# Patient Record
Sex: Male | Born: 1965 | Race: White | Hispanic: No | Marital: Single | Smoking: Never smoker
Health system: Southern US, Community
[De-identification: ages and names within clinical notes are randomized; demographics above are authoritative.]

## PROBLEM LIST (undated history)

## (undated) DIAGNOSIS — E785 Hyperlipidemia, unspecified: Secondary | ICD-10-CM

## (undated) DIAGNOSIS — N400 Enlarged prostate without lower urinary tract symptoms: Secondary | ICD-10-CM

## (undated) DIAGNOSIS — G4730 Sleep apnea, unspecified: Secondary | ICD-10-CM

## (undated) DIAGNOSIS — M5030 Other cervical disc degeneration, unspecified cervical region: Secondary | ICD-10-CM

## (undated) HISTORY — DX: Benign prostatic hyperplasia without lower urinary tract symptoms: N40.0

## (undated) HISTORY — DX: Other cervical disc degeneration, unspecified cervical region: M50.30

## (undated) HISTORY — DX: Sleep apnea, unspecified: G47.30

## (undated) HISTORY — PX: LASIK: SHX215

## (undated) HISTORY — DX: Hyperlipidemia, unspecified: E78.5

---
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
## 1993-01-19 HISTORY — PX: HERNIA REPAIR: SHX51

## 2001-01-11 ENCOUNTER — Observation Stay (HOSPITAL_COMMUNITY): Admission: EM | Admit: 2001-01-11 | Discharge: 2001-01-11 | Payer: Self-pay

## 2008-10-13 ENCOUNTER — Encounter: Admission: RE | Admit: 2008-10-13 | Discharge: 2008-10-13 | Payer: Self-pay | Admitting: Family Medicine

## 2010-02-15 ENCOUNTER — Emergency Department (HOSPITAL_COMMUNITY)
Admission: EM | Admit: 2010-02-15 | Discharge: 2010-02-15 | Payer: Self-pay | Source: Home / Self Care | Admitting: Emergency Medicine

## 2010-02-15 LAB — DIFFERENTIAL
Basophils Absolute: 0 10*3/uL (ref 0.0–0.1)
Basophils Relative: 0 % (ref 0–1)
Eosinophils Absolute: 0 10*3/uL (ref 0.0–0.7)
Eosinophils Relative: 0 % (ref 0–5)
Lymphocytes Relative: 12 % (ref 12–46)
Lymphs Abs: 1.1 10*3/uL (ref 0.7–4.0)
Monocytes Absolute: 1 10*3/uL (ref 0.1–1.0)
Monocytes Relative: 11 % (ref 3–12)
Neutro Abs: 7.2 10*3/uL (ref 1.7–7.7)
Neutrophils Relative %: 77 % (ref 43–77)

## 2010-02-15 LAB — CBC
HCT: 44.4 % (ref 39.0–52.0)
Hemoglobin: 15.1 g/dL (ref 13.0–17.0)
MCH: 30.6 pg (ref 26.0–34.0)
MCHC: 34 g/dL (ref 30.0–36.0)
MCV: 89.9 fL (ref 78.0–100.0)
Platelets: 305 10*3/uL (ref 150–400)
RBC: 4.94 MIL/uL (ref 4.22–5.81)
RDW: 14.5 % (ref 11.5–15.5)
WBC: 9.4 10*3/uL (ref 4.0–10.5)

## 2010-02-15 LAB — COMPREHENSIVE METABOLIC PANEL
ALT: 24 U/L (ref 0–53)
AST: 39 U/L — ABNORMAL HIGH (ref 0–37)
Albumin: 4.4 g/dL (ref 3.5–5.2)
Alkaline Phosphatase: 58 U/L (ref 39–117)
BUN: 11 mg/dL (ref 6–23)
CO2: 26 mEq/L (ref 19–32)
Calcium: 10.1 mg/dL (ref 8.4–10.5)
Chloride: 103 mEq/L (ref 96–112)
Creatinine, Ser: 1.16 mg/dL (ref 0.4–1.5)
GFR calc Af Amer: >60 mL/min (ref >60)
GFR calc non Af Amer: >60 mL/min (ref >60)
Glucose, Bld: 78 mg/dL (ref 70–99)
Potassium: 4.7 mEq/L (ref 3.5–5.1)
Sodium: 139 mEq/L (ref 135–145)
Total Bilirubin: 0.5 mg/dL (ref 0.3–1.2)
Total Protein: 8.1 g/dL (ref 6.0–8.3)

## 2011-08-31 ENCOUNTER — Emergency Department: Payer: Self-pay | Admitting: Emergency Medicine

## 2011-08-31 LAB — CBC
HCT: 42.8 % (ref 40.0–52.0)
MCV: 91 fL (ref 80–100)
Platelet: 282 10*3/uL (ref 150–440)
RDW: 14.1 % (ref 11.5–14.5)
WBC: 6.5 10*3/uL (ref 3.8–10.6)

## 2011-08-31 LAB — COMPREHENSIVE METABOLIC PANEL
Albumin: 3.9 g/dL (ref 3.4–5.0)
BUN: 13 mg/dL (ref 7–18)
EGFR (African American): >60
Glucose: 104 mg/dL — ABNORMAL HIGH (ref 65–99)
SGOT(AST): 25 U/L (ref 15–37)
SGPT (ALT): 39 U/L (ref 12–78)
Total Protein: 8.1 g/dL (ref 6.4–8.2)

## 2011-08-31 LAB — URINALYSIS, COMPLETE
Blood: NEGATIVE
Nitrite: NEGATIVE
Ph: 7 (ref 4.5–8.0)
Protein: NEGATIVE
RBC,UR: NONE SEEN /HPF (ref 0–5)
Specific Gravity: 1.009 (ref 1.003–1.030)

## 2011-08-31 LAB — TROPONIN I: Troponin-I: <0.02 ng/mL

## 2011-08-31 LAB — CK TOTAL AND CKMB (NOT AT ARMC): CK, Total: 69 U/L (ref 35–232)

## 2011-09-08 ENCOUNTER — Ambulatory Visit: Payer: Self-pay | Admitting: Cardiovascular Disease

## 2012-04-25 ENCOUNTER — Telehealth: Payer: Self-pay | Admitting: Family Medicine

## 2012-04-25 NOTE — Telephone Encounter (Signed)
WAS GIVEN SAMPLES OF STAXYN AND WOULD LIKE A RX FOR THIS.   ALSO WANTS TO KNOW IF YOU THINK ZETIA MIGHT BE AN OPTION FOR HIM HE HAS TRIED IT IN THE PAST AND IT WORKED WELL FOR HIM SINCE HE IS UNABLE TO TOLERATE STATINS

## 2012-04-25 NOTE — Telephone Encounter (Signed)
Call in staxyn  1 daily as needed #6 refill prn Can try zetia again and see if it helps , but usually doesn't work as well without statin

## 2012-04-26 NOTE — Telephone Encounter (Signed)
RXs phoned into [facility] Pt notified

## 2012-06-21 ENCOUNTER — Other Ambulatory Visit: Payer: Self-pay

## 2012-06-21 MED ORDER — DICLOFENAC SODIUM 75 MG PO TBEC: 75.0000 mg | DELAYED_RELEASE_TABLET | Freq: Two times a day (BID) | ORAL | Status: DC

## 2012-07-12 ENCOUNTER — Other Ambulatory Visit: Payer: Self-pay | Admitting: Family Medicine

## 2012-07-13 ENCOUNTER — Telehealth: Payer: Self-pay | Admitting: Family Medicine

## 2012-07-13 NOTE — Telephone Encounter (Signed)
Call and see what is going on regarding prostate problem, i.e. what kind of symptoms

## 2012-07-13 NOTE — Telephone Encounter (Signed)
Spoke with pt and he states has had prostate problems and dr Christell Constant usually call in antibiotics [facility] [address]   Sx's   Pressure sensation in rectal area  Denies problems with urination  Allergic to iodine only

## 2012-07-14 NOTE — Telephone Encounter (Signed)
Pt is having pressure and frequency with urination, some flank pain general achiness Wants antibiotic for UTI [facility] [address] Please call to advise

## 2012-07-14 NOTE — Telephone Encounter (Signed)
Patient should really come in and be seen by someone here tomorrow if possible

## 2012-07-15 NOTE — Telephone Encounter (Signed)
Left message for pt that he will need office visit

## 2012-07-20 ENCOUNTER — Telehealth: Payer: Self-pay | Admitting: Family Medicine

## 2012-07-20 NOTE — Telephone Encounter (Signed)
appt given and patient aware

## 2012-07-21 ENCOUNTER — Ambulatory Visit (INDEPENDENT_AMBULATORY_CARE_PROVIDER_SITE_OTHER): Payer: BC Managed Care – PPO | Admitting: Family Medicine

## 2012-07-21 ENCOUNTER — Encounter: Payer: Self-pay | Admitting: Family Medicine

## 2012-07-21 VITALS — BP 112/72 | HR 96 | Temp 98.0°F | Ht 67.5 in | Wt 187.8 lb

## 2012-07-21 DIAGNOSIS — R350 Frequency of micturition: Secondary | ICD-10-CM

## 2012-07-21 DIAGNOSIS — Z789 Other specified health status: Secondary | ICD-10-CM

## 2012-07-21 DIAGNOSIS — E785 Hyperlipidemia, unspecified: Secondary | ICD-10-CM

## 2012-07-21 DIAGNOSIS — E7800 Pure hypercholesterolemia, unspecified: Secondary | ICD-10-CM | POA: Insufficient documentation

## 2012-07-21 DIAGNOSIS — M545 Low back pain: Secondary | ICD-10-CM

## 2012-07-21 LAB — POCT URINALYSIS DIPSTICK
Blood, UA: NEGATIVE
Nitrite, UA: NEGATIVE
Urobilinogen, UA: NEGATIVE
pH, UA: 7

## 2012-07-21 LAB — POCT UA - MICROSCOPIC ONLY
Casts, Ur, LPF, POC: NEGATIVE
Casts, Ur, LPF, POC: NEGATIVE
Crystals, Ur, HPF, POC: NEGATIVE
Yeast, UA: NEGATIVE
Yeast, UA: NEGATIVE

## 2012-07-21 MED ORDER — SULFAMETHOXAZOLE-TRIMETHOPRIM 800-160 MG PO TABS: 1.0000 | ORAL_TABLET | Freq: Two times a day (BID) | ORAL | Status: DC

## 2012-07-21 NOTE — Patient Instructions (Signed)
Continue to drink plenty of fluids. We will call you with the results of the lab work done today and prescribed the appropriate treatment once that information is available here

## 2012-07-21 NOTE — Progress Notes (Addendum)
Subjective:    Patient ID: Christopher Rosales, male    DOB: 1965/08/29, 47 y.o.   MRN: [id number]  HPI Patient comes in today with complaints of rectal pressure urinary frequency and urgency and discomfort with voiding. He has a history of low back pain. He also has some ED. the symptoms associated with this visit have been going on for about 2 weeks.   Review of Systems  Constitutional: Negative for fever.  Gastrointestinal: Positive for rectal pain (more pressure).  Genitourinary: Positive for dysuria (more achiness, rectal pressure), urgency and frequency.  Musculoskeletal: Positive for back pain (LBP, achiness).  Neurological: Negative for headaches.       Objective:   Physical Exam  Vitals reviewed. Constitutional: He is oriented to person, place, and time. He appears well-developed and well-nourished. No distress.  HENT:  Head: Normocephalic and atraumatic.  Cardiovascular: Normal rate, regular rhythm and normal heart sounds.   No murmur heard. Pulmonary/Chest: Effort normal and breath sounds normal. He has no wheezes. He has no rales.  Abdominal: Soft. Bowel sounds are normal. There is tenderness (slight epigastric and suprapubic tenderness, patient also has a small umbilical hernia, and a midline scar from his appendectomy).  Genitourinary: Rectum normal and penis normal.  Prostate slightly enlarged, no lungs or masses. It was tender to palpation  Musculoskeletal: Normal range of motion.  Neurological: He is alert and oriented to person, place, and time.  Skin: Skin is warm and dry. No rash noted.  Psychiatric: He has a normal mood and affect. His behavior is normal. Judgment and thought content normal.   Results for orders placed in visit on 07/21/12  POCT URINALYSIS DIPSTICK      Result Value Range   Color, UA yellow     Clarity, UA clear     Glucose, UA neg     Bilirubin, UA neg     Ketones, UA neg     Spec Grav, UA 1.010     Blood, UA neg     pH, UA 7.0     Protein,  UA mod     Urobilinogen, UA negative     Nitrite, UA neg     Leukocytes, UA Negative    POCT UA - MICROSCOPIC ONLY      Result Value Range   WBC, Ur, HPF, POC neg     RBC, urine, microscopic occ     Bacteria, U Microscopic neg     Mucus, UA trace     Epithelial cells, urine per micros occ     Crystals, Ur, HPF, POC neg     Casts, Ur, LPF, POC neg     Yeast, UA neg    POCT UA - MICROSCOPIC ONLY      Result Value Range   WBC, Ur, HPF, POC 1-2     RBC, urine, microscopic 1-6     Bacteria, U Microscopic occ     Mucus, UA trace     Epithelial cells, urine per micros neg     Crystals, Ur, HPF, POC neg     Casts, Ur, LPF, POC neg     Yeast, UA neg             Assessment & Plan:  1. Urinary frequency - POCT urinalysis dipstick - POCT UA - Microscopic Only - Urine culture - POCT UA - Microscopic Only -Septra DS one twice daily with food for infection until completed  2. Low back pain  3.BPH, mild  4. ED,  history of  Patient Instructions  Continue to drink plenty of fluids. We will call you with the results of the lab work done today and prescribed the appropriate treatment once that information is available here

## 2012-07-21 NOTE — Addendum Note (Signed)
Addended by: Bearl Mulberry on: 07/21/2012 12:25 PM   Modules accepted: Orders

## 2012-07-23 LAB — URINE CULTURE
Colony Count: NO GROWTH
Organism ID, Bacteria: NO GROWTH

## 2012-08-01 ENCOUNTER — Telehealth: Payer: Self-pay | Admitting: Family Medicine

## 2012-08-03 NOTE — Telephone Encounter (Signed)
Have patient come in and get a repeat urinalysis, with a culture and sensitivity

## 2012-08-03 NOTE — Telephone Encounter (Signed)
Has taken the antibiotic for prostatitis and his symptoms of urinary frequency, urgency, and discomfort hasn't improved.  He is also having difficulty maintaining an erection even with the help of medication.  All symptoms started about 3 weeks ago.  Would you like to see him again, order some tests, or change medications?

## 2012-08-05 ENCOUNTER — Telehealth: Payer: Self-pay | Admitting: Family Medicine

## 2012-08-05 NOTE — Telephone Encounter (Signed)
Christopher Rosales said he has called 3 times this week and a nurse did call him but feels he needs to talk to Dr. Christell Constant

## 2012-08-05 NOTE — Telephone Encounter (Signed)
Please give this patient on the phone for me the first thing on Monday morning

## 2012-08-08 NOTE — Telephone Encounter (Signed)
appt made for thursday

## 2012-08-08 NOTE — Telephone Encounter (Signed)
He should come back for a rectal exam and a repeat urine, and urine for CNS after the exam is done

## 2012-08-08 NOTE — Telephone Encounter (Signed)
Bactrim has not helped the prostate infection- has now finished med Lots of pressure in rectal area, low abd pain, frequecy  What do you think we need to try/do next

## 2012-08-11 ENCOUNTER — Encounter: Payer: Self-pay | Admitting: Family Medicine

## 2012-08-11 ENCOUNTER — Ambulatory Visit (INDEPENDENT_AMBULATORY_CARE_PROVIDER_SITE_OTHER): Payer: BC Managed Care – PPO | Admitting: Family Medicine

## 2012-08-11 ENCOUNTER — Ambulatory Visit (INDEPENDENT_AMBULATORY_CARE_PROVIDER_SITE_OTHER): Payer: BC Managed Care – PPO

## 2012-08-11 VITALS — BP 122/79 | HR 83 | Temp 98.5°F | Ht 68.0 in | Wt 189.6 lb

## 2012-08-11 DIAGNOSIS — M545 Low back pain: Secondary | ICD-10-CM

## 2012-08-11 DIAGNOSIS — R109 Unspecified abdominal pain: Secondary | ICD-10-CM

## 2012-08-11 LAB — POCT UA - MICROSCOPIC ONLY: Casts, Ur, LPF, POC: NEGATIVE

## 2012-08-11 NOTE — Progress Notes (Signed)
  Subjective:    Patient ID: Christopher Rosales, male    DOB: 11/06/1965, 47 y.o.   MRN: [id number]  HPI Patient has been on Septra for about 10 days and this antibiotic does not seem to be helping his symptoms of rectal pressure and tightness. No burning or dysuria with voiding. This is a persistent annoying dull aching sensation in his low back and down in the suprapubic area.   Review of Systems  Constitutional: Negative for fever, chills, appetite change and unexpected weight change.  Respiratory: Negative.   Cardiovascular: Negative.   Gastrointestinal: Positive for abdominal pain and abdominal distention. Negative for nausea, vomiting, diarrhea, constipation, blood in stool and anal bleeding. Rectal pain: just achiness and pressure.       A bloated sensation comes and goes in his abdomen  Endocrine: Negative for polydipsia, polyphagia and polyuria.  Genitourinary: Positive for urgency and frequency. Negative for dysuria, hematuria, discharge, scrotal swelling, difficulty urinating, penile pain and testicular pain. Flank pain: achiness both sides at times.  Musculoskeletal: Positive for back pain.  Skin: Negative for rash.  Neurological: Negative for light-headedness and headaches.  Psychiatric/Behavioral: Negative.        Objective:   Physical Exam  Vitals reviewed. Constitutional: He is oriented to person, place, and time. He appears well-developed and well-nourished. No distress.  HENT:  Head: Normocephalic.  Eyes: Conjunctivae are normal. Right eye exhibits no discharge. Left eye exhibits no discharge. Scleral icterus is present.  Neck: Normal range of motion. Neck supple.  Cardiovascular: Normal rate, regular rhythm and normal heart sounds.  Exam reveals no gallop and no friction rub.   No murmur heard. Pulmonary/Chest: Effort normal and breath sounds normal. No respiratory distress. He has no wheezes. He has no rales.  Abdominal: Soft. Bowel sounds are normal. He exhibits no  distension. There is no tenderness. There is no rebound and no guarding.  Patient has a midline scar from an appendectomy, and also several scars from drainage sites  Genitourinary:  Minimal prostate enlargement with minimal tenderness. There were no lumps. There were no rectal masses  Musculoskeletal: Normal range of motion.  Neurological: He is alert and oriented to person, place, and time.  Skin: Skin is warm and dry. No rash noted. He is not diaphoretic.  Psychiatric: He has a normal mood and affect. His behavior is normal. Judgment and thought content normal.  Somewhat anxious about his condition.   [facility] reading (PRIMARY) by  Dr. Christell Constant: LS-spine;wnl                                         Assessment & Plan:  1. Abdominal pain -Get CT of abdomen and pelvis  2. Low back pain, non-specific - DG Lumbar Spine 2-3 Views; Future   Patient Instructions  Hold the Voltaren and take only extra strength Tylenol Drink plenty of fluid We will arrange to get CT scan of abdomen and pelvis We will get LS spine films today   Nyra Capes MD

## 2012-08-11 NOTE — Addendum Note (Signed)
Addended by: Prescott Gum on: 08/11/2012 02:46 PM   Modules accepted: Orders

## 2012-08-11 NOTE — Patient Instructions (Addendum)
Hold the Voltaren and take only extra strength Tylenol Drink plenty of fluid We will arrange to get CT scan of abdomen and pelvis We will get LS spine films today

## 2012-08-15 ENCOUNTER — Ambulatory Visit (HOSPITAL_COMMUNITY): Payer: BC Managed Care – PPO

## 2012-08-15 ENCOUNTER — Other Ambulatory Visit: Payer: Self-pay | Admitting: Family Medicine

## 2012-08-17 ENCOUNTER — Ambulatory Visit (HOSPITAL_COMMUNITY)
Admission: RE | Admit: 2012-08-17 | Discharge: 2012-08-17 | Disposition: A | Discharge status: Home / Self Care | Payer: BC Managed Care – PPO | Source: Ambulatory Visit | Attending: Family Medicine | Admitting: Family Medicine

## 2012-08-17 DIAGNOSIS — R109 Unspecified abdominal pain: Secondary | ICD-10-CM

## 2012-08-18 ENCOUNTER — Telehealth: Payer: Self-pay | Admitting: Family Medicine

## 2012-08-18 DIAGNOSIS — R1084 Generalized abdominal pain: Secondary | ICD-10-CM

## 2012-08-19 NOTE — Telephone Encounter (Signed)
Please call patient and let him know that we would like for him to see a gastroenterologist because of the persistent pain and negative workup so far

## 2012-08-19 NOTE — Telephone Encounter (Signed)
Wants diclofenac refill he wants it for more than one month at a time. And still having the same issue from why  He had the CT scan he would like to know what the next step for him to do?

## 2012-08-23 ENCOUNTER — Encounter: Payer: Self-pay | Admitting: *Deleted

## 2012-09-25 ENCOUNTER — Other Ambulatory Visit: Payer: Self-pay | Admitting: Family Medicine

## 2012-11-23 ENCOUNTER — Other Ambulatory Visit: Payer: Self-pay | Admitting: Family Medicine

## 2012-11-24 ENCOUNTER — Other Ambulatory Visit: Payer: Self-pay

## 2013-01-04 ENCOUNTER — Other Ambulatory Visit: Payer: Self-pay | Admitting: Family Medicine

## 2013-01-05 NOTE — Telephone Encounter (Signed)
Last seen 08/11/12  DWM 

## 2013-02-22 ENCOUNTER — Other Ambulatory Visit: Payer: Self-pay | Admitting: Family Medicine

## 2013-02-24 NOTE — Telephone Encounter (Signed)
Last seen 08/11/12  DWM 

## 2013-04-03 ENCOUNTER — Other Ambulatory Visit: Payer: Self-pay | Admitting: Family Medicine

## 2013-04-05 NOTE — Telephone Encounter (Signed)
Last ov 7/14. 

## 2013-05-02 ENCOUNTER — Other Ambulatory Visit: Payer: Self-pay | Admitting: Family Medicine

## 2013-05-09 ENCOUNTER — Telehealth: Payer: Self-pay | Admitting: Family Medicine

## 2013-05-09 NOTE — Telephone Encounter (Signed)
Please advise. Patient last seen in office on 08-11-12.

## 2013-05-09 NOTE — Telephone Encounter (Signed)
Please refill this prescription with refills----

## 2013-05-12 ENCOUNTER — Other Ambulatory Visit: Payer: Self-pay | Admitting: Family Medicine

## 2013-05-12 MED ORDER — VARDENAFIL HCL 10 MG PO TBDP: 1.0000 | ORAL_TABLET | ORAL | Status: DC | PRN

## 2013-05-12 NOTE — Telephone Encounter (Signed)
Rx sent in

## 2013-06-04 ENCOUNTER — Other Ambulatory Visit: Payer: Self-pay | Admitting: Family Medicine

## 2013-06-05 NOTE — Telephone Encounter (Signed)
LAST SEEN 08/11/12

## 2013-07-31 ENCOUNTER — Other Ambulatory Visit: Payer: Self-pay | Admitting: Family Medicine

## 2013-09-26 ENCOUNTER — Other Ambulatory Visit: Payer: Self-pay | Admitting: Family Medicine

## 2013-09-26 NOTE — Telephone Encounter (Signed)
Last ov 7/15. 

## 2013-11-08 ENCOUNTER — Ambulatory Visit (INDEPENDENT_AMBULATORY_CARE_PROVIDER_SITE_OTHER): Payer: PRIVATE HEALTH INSURANCE | Admitting: Family Medicine

## 2013-11-08 ENCOUNTER — Ambulatory Visit (INDEPENDENT_AMBULATORY_CARE_PROVIDER_SITE_OTHER): Payer: PRIVATE HEALTH INSURANCE

## 2013-11-08 ENCOUNTER — Encounter: Payer: Self-pay | Admitting: Family Medicine

## 2013-11-08 VITALS — BP 121/74 | HR 73 | Temp 98.0°F | Ht 68.0 in | Wt 184.0 lb

## 2013-11-08 DIAGNOSIS — N400 Enlarged prostate without lower urinary tract symptoms: Secondary | ICD-10-CM

## 2013-11-08 DIAGNOSIS — J301 Allergic rhinitis due to pollen: Secondary | ICD-10-CM

## 2013-11-08 DIAGNOSIS — E785 Hyperlipidemia, unspecified: Secondary | ICD-10-CM

## 2013-11-08 DIAGNOSIS — Z0000 Encounter for general adult medical examination without abnormal findings: Secondary | ICD-10-CM

## 2013-11-08 DIAGNOSIS — M5030 Other cervical disc degeneration, unspecified cervical region: Secondary | ICD-10-CM | POA: Insufficient documentation

## 2013-11-08 DIAGNOSIS — N529 Male erectile dysfunction, unspecified: Secondary | ICD-10-CM | POA: Insufficient documentation

## 2013-11-08 DIAGNOSIS — N5201 Erectile dysfunction due to arterial insufficiency: Secondary | ICD-10-CM

## 2013-11-08 DIAGNOSIS — G629 Polyneuropathy, unspecified: Secondary | ICD-10-CM

## 2013-11-08 DIAGNOSIS — E559 Vitamin D deficiency, unspecified: Secondary | ICD-10-CM

## 2013-11-08 DIAGNOSIS — Z1211 Encounter for screening for malignant neoplasm of colon: Secondary | ICD-10-CM

## 2013-11-08 LAB — POCT CBC
Granulocyte percent: 70.5 %G (ref 37–80)
HCT, POC: 44.9 % (ref 43.5–53.7)
HEMOGLOBIN: 14.5 g/dL (ref 14.1–18.1)
LYMPH, POC: 1.6 (ref 0.6–3.4)
MCH: 29.8 pg (ref 27–31.2)
MCHC: 32.3 g/dL (ref 31.8–35.4)
MCV: 92.2 fL (ref 80–97)
MPV: 8.2 fL (ref 0–99.8)
PLATELET COUNT, POC: 292 10*3/uL (ref 142–424)
POC Granulocyte: 4.4 (ref 2–6.9)
POC LYMPH PERCENT: 25.8 %L (ref 10–50)
RBC: 4.9 M/uL (ref 4.69–6.13)
RDW, POC: 14.3 %
WBC: 6.2 10*3/uL (ref 4.6–10.2)

## 2013-11-08 LAB — POCT URINALYSIS DIPSTICK
BILIRUBIN UA: NEGATIVE
Blood, UA: NEGATIVE
GLUCOSE UA: NEGATIVE
KETONES UA: NEGATIVE
LEUKOCYTES UA: NEGATIVE
Nitrite, UA: NEGATIVE
Protein, UA: NEGATIVE
Spec Grav, UA: 1.01
Urobilinogen, UA: NEGATIVE
pH, UA: 6

## 2013-11-08 LAB — POCT UA - MICROSCOPIC ONLY
BACTERIA, U MICROSCOPIC: NEGATIVE
Casts, Ur, LPF, POC: NEGATIVE
Crystals, Ur, HPF, POC: NEGATIVE
MUCUS UA: NEGATIVE
RBC, urine, microscopic: NEGATIVE
WBC, UR, HPF, POC: NEGATIVE
Yeast, UA: NEGATIVE

## 2013-11-08 MED ORDER — DICLOFENAC SODIUM 75 MG PO TBEC: DELAYED_RELEASE_TABLET | ORAL | Status: DC

## 2013-11-08 MED ORDER — VARDENAFIL HCL 10 MG PO TBDP: 1.0000 | ORAL_TABLET | ORAL | Status: DC | PRN

## 2013-11-08 NOTE — Patient Instructions (Addendum)
Continue current medications. Continue good therapeutic lifestyle changes which include good diet and exercise. Fall precautions discussed with patient. If an FOBT was given today- please return it to our front desk. If you are over 50 years old - you may need Prevnar 13 or the adult Pneumonia vaccine.  Flu Shots will be available at our office starting mid- September. Please call and schedule a FLU CLINIC APPOINTMENT.   We will call you with the lab work results once those results are available. Check with your insurance regarding the Prevnar vaccine, the shingles shot and a flu shot. If you continue to have numbness in her left arm or if you see a progression and is getting worse he should call back the neurosurgeon and see if he would be willing to do an injection in your neck For the nasal congestion, use some saline nose spray. You may also want to try some Claritin 1 daily and Flonase 1 or 2 sprays each nostril at bedtime If you start running a fever or the drainage becomes more purulent with sinus headache we may need to start you on an antibiotic Please follow-through and get your colonoscopy done. Please return the FOBT We will call you with your lab work once this lab work is available.

## 2013-11-08 NOTE — Progress Notes (Signed)
Subjective:    Patient ID: Christopher Rosales, male    DOB: Jan 08, 1966, 48 y.o.   MRN: [id number]  HPI Patient is here today for annual wellness exam and follow up of chronic medical problems. The main complaint today is head congestion and neck pain. The patient has a history of degenerative disc disease of the cervical spine. He takes diclofenac for this. He also has a history of erectile dysfunction and takes Levitra.          Patient Active Problem List   Diagnosis Date Noted  . BPH (benign prostatic hyperplasia) 11/08/2013  . Vitamin D deficiency 11/08/2013  . Hyperlipidemia 07/21/2012  . Statin intolerance 07/21/2012   Outpatient Encounter Prescriptions as of 11/08/2013  Medication Sig  . diclofenac (VOLTAREN) 75 MG EC tablet TAKE 1 TABLET BY MOUTH TWICE A DAY  . Vardenafil HCl (STAXYN) 10 MG TBDP Take 1 tablet by mouth as needed.  . [DISCONTINUED] sulfamethoxazole-trimethoprim (BACTRIM DS,SEPTRA DS) 800-160 MG per tablet Take 1 tablet by mouth 2 (two) times daily.    Review of Systems  Constitutional: Negative.   HENT: Positive for congestion (in left ear and left side nose- going on for awhile).   Eyes: Negative.   Respiratory: Negative.   Cardiovascular: Negative.   Gastrointestinal: Negative.   Endocrine: Negative.   Genitourinary: Negative.   Musculoskeletal: Positive for neck pain.  Skin: Negative.   Allergic/Immunologic: Negative.   Neurological: Negative.   Hematological: Negative.   Psychiatric/Behavioral: Negative.        Objective:   Physical Exam  Nursing note and vitals reviewed. Constitutional: He is oriented to person, place, and time. He appears well-developed and well-nourished. No distress.  HENT:  Head: Normocephalic and atraumatic.  Right Ear: External ear normal.  Left Ear: External ear normal.  Mouth/Throat: Oropharynx is clear and moist. No oropharyngeal exudate.  Throat is normal. Nasal congestion bilateral  Eyes: Conjunctivae and EOM  are normal. Pupils are equal, round, and reactive to light. Right eye exhibits no discharge. Left eye exhibits no discharge. No scleral icterus.  Neck: Normal range of motion. Neck supple. No thyromegaly present.  No anterior cervical nodes or carotid bruits  Cardiovascular: Normal rate, regular rhythm, normal heart sounds and intact distal pulses.  Exam reveals no gallop and no friction rub.   No murmur heard. At 72 per minute  Pulmonary/Chest: Effort normal and breath sounds normal. No respiratory distress. He has no wheezes. He has no rales. He exhibits no tenderness.  Lungs are clear anteriorly and posteriorly  Abdominal: Soft. Bowel sounds are normal. He exhibits no mass. There is no tenderness. There is no rebound and no guarding.  Genitourinary: Rectum normal and penis normal. No penile tenderness.  The prostate is slightly enlarged, but soft and smooth bumps or masses. There were no rectal masses. There was no inguinal hernia palpated and the external genitalia were normal. There were no inguinal nodes.  Musculoskeletal: Normal range of motion. He exhibits no edema and no tenderness.  Lymphadenopathy:    He has no cervical adenopathy.  Neurological: He is alert and oriented to person, place, and time. He has normal reflexes. No cranial nerve deficit.  Skin: Skin is warm and dry. No rash noted. No erythema. No pallor.  Psychiatric: He has a normal mood and affect. His behavior is normal. Judgment and thought content normal.    BP 121/74  Pulse 73  Temp(Src) 98 F (36.7 C) (Oral)  Ht _0  (1.727 m)  Wt  184 lb (83.462 kg)  BMI 27.98 kg/m2  EKG: Normal sinus rhythm  [facility] reading (PRIMARY) by  Dr.Shianna Bally-chest x-ray--  no active disease                                      Assessment & Plan:  1. Annual physical exam - POCT urinalysis dipstick - POCT UA - Microscopic Only - Urine culture - EKG 12-Lead - POCT CBC - BMP8+EGFR - Hepatic function panel - NMR, lipoprofile -  PSA, total and free - Vit D  25 hydroxy (rtn osteoporosis monitoring) - Testosterone,Free and Total - Thyroid Panel With TSH  2. BPH (benign prostatic hyperplasia - POCT urinalysis dipstick - POCT UA - Microscopic Only - Urine culture - POCT CBC - PSA, total and free - Testosterone,Free and Total  3. Hyperlipidemia - POCT CBC - NMR, lipoprofile  4. Vitamin D deficiency - POCT CBC - Vit D  25 hydroxy (rtn osteoporosis monitoring)  5. Special screening for malignant neoplasms, colon - Ambulatory referral to Gastroenterology - POCT CBC  6. DDD (degenerative disc disease), cervical  7. Erectile dysfunction due to arterial insufficiency  8. Peripheral neuropathy  9. Allergic rhinitis due to pollen   Meds ordered this encounter  Medications  . diclofenac (VOLTAREN) 75 MG EC tablet    Sig: TAKE 1 TABLET BY MOUTH TWICE A DAY    Dispense:  60 tablet    Refill:  4  . Vardenafil HCl (STAXYN) 10 MG TBDP    Sig: Take 1 tablet by mouth as needed.    Dispense:  30 tablet    Refill:  4   Patient Instructions  Continue current medications. Continue good therapeutic lifestyle changes which include good diet and exercise. Fall precautions discussed with patient. If an FOBT was given today- please return it to our front desk. If you are over 78 years old - you may need Prevnar 48 or the adult Pneumonia vaccine.  Flu Shots will be available at our office starting mid- September. Please call and schedule a FLU CLINIC APPOINTMENT.   We will call you with the lab work results once those results are available. Check with your insurance regarding the Prevnar vaccine, the shingles shot and a flu shot. If you continue to have numbness in her left arm or if you see a progression and is getting worse he should call back the neurosurgeon and see if he would be willing to do an injection in your neck For the nasal congestion, use some saline nose spray. You may also want to try some  Claritin 1 daily and Flonase 1 or 2 sprays each nostril at bedtime If you start running a fever or the drainage becomes more purulent with sinus headache we may need to start you on an antibiotic Please follow-through and get your colonoscopy done. Please return the FOBT We will call you with your lab work once this lab work is available.   Arrie Senate MD

## 2013-11-09 LAB — HEPATIC FUNCTION PANEL
ALT: 37 IU/L (ref 0–44)
AST: 26 IU/L (ref 0–40)
Albumin: 4.6 g/dL (ref 3.5–5.5)
Alkaline Phosphatase: 64 IU/L (ref 39–117)
BILIRUBIN DIRECT: 0.14 mg/dL (ref 0.00–0.40)
Total Bilirubin: 0.6 mg/dL (ref 0.0–1.2)
Total Protein: 7.3 g/dL (ref 6.0–8.5)

## 2013-11-09 LAB — BMP8+EGFR
BUN / CREAT RATIO: 14 (ref 9–20)
BUN: 15 mg/dL (ref 6–24)
CALCIUM: 10 mg/dL (ref 8.7–10.2)
CO2: 23 mmol/L (ref 18–29)
Chloride: 99 mmol/L (ref 97–108)
Creatinine, Ser: 1.08 mg/dL (ref 0.76–1.27)
GFR calc Af Amer: 92 mL/min/{1.73_m2} (ref >59)
GFR, EST NON AFRICAN AMERICAN: 80 mL/min/{1.73_m2} (ref >59)
Glucose: 98 mg/dL (ref 65–99)
POTASSIUM: 4.2 mmol/L (ref 3.5–5.2)
SODIUM: 138 mmol/L (ref 134–144)

## 2013-11-09 LAB — PSA, TOTAL AND FREE
PSA FREE PCT: 32 %
PSA FREE: 0.16 ng/mL
PSA: 0.5 ng/mL (ref 0.0–4.0)

## 2013-11-09 LAB — THYROID PANEL WITH TSH
FREE THYROXINE INDEX: 2.7 (ref 1.2–4.9)
T3 UPTAKE RATIO: 32 % (ref 24–39)
T4 TOTAL: 8.5 ug/dL (ref 4.5–12.0)
TSH: 1.55 u[IU]/mL (ref 0.450–4.500)

## 2013-11-09 LAB — NMR, LIPOPROFILE
Cholesterol: 240 mg/dL — ABNORMAL HIGH (ref 100–199)
HDL Cholesterol by NMR: 48 mg/dL (ref >39)
HDL Particle Number: 34.7 umol/L (ref >=30.5)
LDL PARTICLE NUMBER: 1938 nmol/L — AB (ref <1000)
LDL SIZE: 20.2 nm (ref >20.5)
LDLC SERPL CALC-MCNC: 146 mg/dL — ABNORMAL HIGH (ref 0–99)
LP-IR SCORE: 82 — AB (ref <=45)
SMALL LDL PARTICLE NUMBER: 1092 nmol/L — AB (ref <=527)
TRIGLYCERIDES BY NMR: 232 mg/dL — AB (ref 0–149)

## 2013-11-09 LAB — VITAMIN D 25 HYDROXY (VIT D DEFICIENCY, FRACTURES): Vit D, 25-Hydroxy: 23.3 ng/mL — ABNORMAL LOW (ref 30.0–100.0)

## 2013-11-09 LAB — TESTOSTERONE,FREE AND TOTAL
Testosterone, Free: 7.9 pg/mL (ref 7.2–24.0)
Testosterone: 355 ng/dL (ref 348–1197)

## 2013-11-09 LAB — URINE CULTURE: ORGANISM ID, BACTERIA: NO GROWTH

## 2013-11-16 ENCOUNTER — Other Ambulatory Visit: Payer: Self-pay | Admitting: Family Medicine

## 2014-03-16 ENCOUNTER — Ambulatory Visit (INDEPENDENT_AMBULATORY_CARE_PROVIDER_SITE_OTHER): Payer: PRIVATE HEALTH INSURANCE | Admitting: Family Medicine

## 2014-03-16 ENCOUNTER — Encounter: Payer: Self-pay | Admitting: Family Medicine

## 2014-03-16 VITALS — BP 138/95 | HR 76 | Temp 98.1°F | Ht 68.0 in | Wt 194.0 lb

## 2014-03-16 DIAGNOSIS — R030 Elevated blood-pressure reading, without diagnosis of hypertension: Secondary | ICD-10-CM

## 2014-03-16 DIAGNOSIS — M7989 Other specified soft tissue disorders: Secondary | ICD-10-CM

## 2014-03-16 NOTE — Patient Instructions (Signed)
Continue with regular activity If problems continue with the appearance and the axilla of it being fuller than usual, we can get a special x-ray to look at this further if it continues to be of concern to you. At this point in time I think it is appropriate to wait and watch.

## 2014-03-16 NOTE — Progress Notes (Signed)
   Subjective:    Patient ID: Christopher Rosales, male    DOB: Nov 10, 1965, 49 y.o.   MRN: [id number]  HPI Patient here today for swelling and slight pain under the left arm. This was first noticed about 4-5 weeks ago. It was noted that the patient's weight is up 10 pounds today. His initial blood pressure was elevated and it was rechecked at 138/90. He says that his blood pressures at home run between 120-125/80-85.        Patient Active Problem List   Diagnosis Date Noted  . BPH (benign prostatic hyperplasia) 11/08/2013  . Vitamin D deficiency 11/08/2013  . DDD (degenerative disc disease), cervical 11/08/2013  . Erectile dysfunction 11/08/2013  . Peripheral neuropathy 11/08/2013  . Hyperlipidemia 07/21/2012  . 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 diclofenac (VOLTAREN) 75 MG EC tablet TAKE 1 TABLET BY MOUTH TWICE A DAY  . Vardenafil HCl (STAXYN) 10 MG TBDP Take 1 tablet by mouth as needed.    Review of Systems  Constitutional: Negative.   HENT: Negative.   Eyes: Negative.   Respiratory: Negative.   Cardiovascular: Negative.   Gastrointestinal: Negative.   Endocrine: Negative.   Genitourinary: Negative.   Musculoskeletal: Negative.   Skin: Negative.        Swelling under left arm   Allergic/Immunologic: Negative.   Neurological: Negative.   Hematological: Negative.   Psychiatric/Behavioral: Negative.        Objective:   Physical Exam  Constitutional: He appears well-developed and well-nourished.  HENT:  Head: Normocephalic.  Pulmonary/Chest: He exhibits no tenderness.  The chest wall was clear of any masses in the axillary regions bilaterally were palpated without adenopathy fullness or masses.  Musculoskeletal: Normal range of motion. He exhibits no tenderness.  There was no tenderness or fullness in the left axillary region  Neurological: He is alert.  Skin: Skin is warm. No rash noted.    Psychiatric: He has a normal mood and affect. His behavior is normal. Judgment and thought content normal.  Vitals reviewed.  BP 138/95 mmHg  Pulse 76  Temp(Src) 98.1 F (36.7 C) (Oral)  Ht 5\' 8"  (1.727 m)  Wt 194 lb (87.998 kg)  BMI 29.50 kg/m2        Assessment & Plan:   1. Left axillary swelling -No masses or abnormality were found and patient was reassured about this.  2. Increased blood pressure (not hypertension) -The patient indicates that his blood pressures at home are better than in the office. And they run between 120-125/80-85. -He should watch his salt intake more closely and work on losing weight and has been gain since the last visit.  Patient Instructions  Continue with regular activity If problems continue with the appearance and the axilla of it being fuller than usual, we can get a special x-ray to look at this further if it continues to be of concern to you. At this point in time I think it is appropriate to wait and watch.   Nyra Capeson W. Moore MD

## 2014-03-30 ENCOUNTER — Ambulatory Visit: Payer: Self-pay | Admitting: Gastroenterology

## 2014-05-14 LAB — SURGICAL PATHOLOGY

## 2014-11-06 ENCOUNTER — Encounter: Payer: Self-pay | Admitting: Family Medicine

## 2014-11-09 ENCOUNTER — Other Ambulatory Visit: Payer: Self-pay | Admitting: Family Medicine

## 2014-11-09 NOTE — Telephone Encounter (Signed)
This is okay to refill but the patient may not want to get 30 at a time. Please confirm with the patient how many he wants at a time and refill this for one year

## 2014-11-13 ENCOUNTER — Ambulatory Visit: Payer: PRIVATE HEALTH INSURANCE | Admitting: Family Medicine

## 2015-01-02 ENCOUNTER — Encounter: Payer: Self-pay | Admitting: Family Medicine

## 2015-01-02 ENCOUNTER — Ambulatory Visit (INDEPENDENT_AMBULATORY_CARE_PROVIDER_SITE_OTHER): Payer: PRIVATE HEALTH INSURANCE | Admitting: Family Medicine

## 2015-01-02 VITALS — BP 110/74 | HR 83 | Temp 97.6°F | Ht 68.0 in | Wt 187.0 lb

## 2015-01-02 DIAGNOSIS — Z23 Encounter for immunization: Secondary | ICD-10-CM

## 2015-01-02 DIAGNOSIS — N400 Enlarged prostate without lower urinary tract symptoms: Secondary | ICD-10-CM

## 2015-01-02 DIAGNOSIS — N5201 Erectile dysfunction due to arterial insufficiency: Secondary | ICD-10-CM | POA: Diagnosis not present

## 2015-01-02 DIAGNOSIS — E559 Vitamin D deficiency, unspecified: Secondary | ICD-10-CM

## 2015-01-02 DIAGNOSIS — Z0000 Encounter for general adult medical examination without abnormal findings: Secondary | ICD-10-CM

## 2015-01-02 DIAGNOSIS — E785 Hyperlipidemia, unspecified: Secondary | ICD-10-CM

## 2015-01-02 DIAGNOSIS — M5030 Other cervical disc degeneration, unspecified cervical region: Secondary | ICD-10-CM

## 2015-01-02 LAB — POCT UA - MICROSCOPIC ONLY
Bacteria, U Microscopic: NEGATIVE
Casts, Ur, LPF, POC: NEGATIVE
Crystals, Ur, HPF, POC: NEGATIVE
Mucus, UA: NEGATIVE
RBC, URINE, MICROSCOPIC: NEGATIVE
WBC, UR, HPF, POC: NEGATIVE
YEAST UA: NEGATIVE

## 2015-01-02 LAB — POCT URINALYSIS DIPSTICK
Bilirubin, UA: NEGATIVE
Blood, UA: NEGATIVE
Glucose, UA: NEGATIVE
Ketones, UA: NEGATIVE
LEUKOCYTES UA: NEGATIVE
NITRITE UA: NEGATIVE
PH UA: 7
PROTEIN UA: NEGATIVE
Spec Grav, UA: 1.01
UROBILINOGEN UA: NEGATIVE

## 2015-01-02 MED ORDER — VARDENAFIL HCL 10 MG PO TBDP: ORAL_TABLET | ORAL | Status: DC

## 2015-01-02 MED ORDER — DICLOFENAC SODIUM 75 MG PO TBEC: DELAYED_RELEASE_TABLET | ORAL | Status: DC

## 2015-01-02 NOTE — Patient Instructions (Addendum)
Continue current medications. Continue good therapeutic lifestyle changes which include good diet and exercise. Fall precautions discussed with patient. If an FOBT was given today- please return it to our front desk. If you are over 51 years old - you may need Prevnar 13 or the adult Pneumonia vaccine.  **Flu shots are available--- please call and schedule a FLU-CLINIC appointment**  After your visit with us today you will receive a survey in the mail or online from [employer] regarding your care with us. Please take a moment to fill this out. Your feedback is very important to us as you can help us better understand your patient needs as well as improve your experience and satisfaction. WE CARE ABOUT YOU!!!   The patient should follow-up with the gastroenterologist as planned for a repeat colonoscopy He should return his FOBT in March This winter he should drink plenty of fluids and stay well hydrated He should check with his insurance regarding the Prevnar vaccine and the shingles shot He should continue to stay active physically but at the same time being careful not to fall and injure his neck

## 2015-01-02 NOTE — Progress Notes (Signed)
Subjective:    Patient ID: Christopher Rosales, male    DOB: 14-Jan-1966, 49 y.o.   MRN: [id number]  HPI Patient is here today for annual wellness exam and follow up of chronic medical problems which includes hyperlipidemia. He is taking medications regularly. The patient is doing well overall and has no specific complaints. He says his neck pain may be a little worse with the pain radiating to both arms. He saw Dr. Arnoldo Morale in the past about this and knows how to get in touch with him if there is any progression of this problem. He also continues to have erectile dysfunction but this is stable and the medicine that he is taking helps this. He refuses to take the flu shot. 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 The patient denies chest pain shortness of breath trouble swallowing heartburn indigestion nausea vomiting diarrhea or blood in the stool. He is passing his water without problems and his erectile dysfunction is stable. It is significant to know he had a colonoscopy and had 3 precancerous polyps and says that he will be due another colonoscopy in 3 years. The patient is adopted and he does not know the health status of his true parents.     Patient Active Problem List   Diagnosis Date Noted  . BPH (benign prostatic hyperplasia) 11/08/2013  . Vitamin D deficiency 11/08/2013  . DDD (degenerative disc disease), cervical 11/08/2013  . Erectile dysfunction 11/08/2013  . Peripheral neuropathy ([place]) 11/08/2013  . Hyperlipidemia 07/21/2012  . Statin intolerance 07/21/2012   Outpatient Encounter Prescriptions as of 01/02/2015  Medication Sig  . diclofenac (VOLTAREN) 75 MG EC tablet TAKE 1 TABLET BY MOUTH TWICE A DAY  . Vardenafil HCl (STAXYN) 10 MG TBDP TAKE 1 TABLET BY MOUTH AS NEEDED AS DIRECTED  . [DISCONTINUED] diclofenac (VOLTAREN) 75 MG EC tablet TAKE 1 TABLET BY MOUTH TWICE A DAY  . [DISCONTINUED] STAXYN 10 MG TBDP TAKE 1 TABLET BY MOUTH AS NEEDED AS DIRECTED   No  facility-administered encounter medications on file as of 01/02/2015.      Review of Systems  Constitutional: Negative.   HENT: Negative.   Eyes: Negative.   Respiratory: Negative.   Cardiovascular: Negative.   Gastrointestinal: Negative.   Endocrine: Negative.   Genitourinary: Negative.   Musculoskeletal: Negative.   Skin: Negative.   Allergic/Immunologic: Negative.   Neurological: Negative.   Hematological: Negative.   Psychiatric/Behavioral: Negative.        Objective:   Physical Exam  Constitutional: He is oriented to person, place, and time. He appears well-developed and well-nourished.  HENT:  Head: Normocephalic and atraumatic.  Right Ear: External ear normal.  Left Ear: External ear normal.  Mouth/Throat: Oropharynx is clear and moist. No oropharyngeal exudate.  Slight nasal congestion left greater than right  Eyes: Conjunctivae and EOM are normal. Pupils are equal, round, and reactive to light. Right eye exhibits no discharge. Left eye exhibits no discharge. No scleral icterus.  The patient indicates that his eye exam and dental exam are both due and he plans to get this arranged soon.  Neck: Normal range of motion. Neck supple. No thyromegaly present.  Cardiovascular: Normal rate, regular rhythm, normal heart sounds and intact distal pulses.   No murmur heard. The rhythm is regular at 72/m and there were no murmurs heard.  Pulmonary/Chest: Effort normal and breath sounds normal. No respiratory distress. He has no wheezes. He has no rales. He exhibits no tenderness.  The  axillary region was negative for adenopathy and the lungs were clear anteriorly and posteriorly  Abdominal: Soft. Bowel sounds are normal. He exhibits no mass. There is no tenderness. There is no rebound and no guarding.  The patient does have an umbilical hernia and a lower midline scar from an appendectomy. There were no masses tenderness or organ enlargement or bruits  Genitourinary: Rectum  normal, prostate normal and penis normal.  The prostate was minimally enlarged if any. It was smooth without lumps or masses. The rectum was clear of any masses in the external genitalia were normal and there is no inguinal hernia palpable.  Musculoskeletal: Normal range of motion. He exhibits no edema.  Lymphadenopathy:    He has no cervical adenopathy.  Neurological: He is alert and oriented to person, place, and time. He has normal reflexes. No cranial nerve deficit.  Skin: Skin is warm and dry. No rash noted.  Psychiatric: He has a normal mood and affect. His behavior is normal. Judgment and thought content normal.  Nursing note and vitals reviewed.   BP 110/74 mmHg  Pulse 83  Temp(Src) 97.6 F (36.4 C) (Oral)  Ht _0  (1.727 m)  Wt 187 lb (84.823 kg)  BMI 28.44 kg/m2  Results for orders placed or performed in visit on 01/02/15  POCT UA - Microscopic Only  Result Value Ref Range   WBC, Ur, HPF, POC neg    RBC, urine, microscopic neg    Bacteria, U Microscopic neg    Mucus, UA neg    Epithelial cells, urine per micros occ    Crystals, Ur, HPF, POC neg    Casts, Ur, LPF, POC neg    Yeast, UA neg   POCT urinalysis dipstick  Result Value Ref Range   Color, UA yellow    Clarity, UA clear    Glucose, UA neg    Bilirubin, UA neg    Ketones, UA neg    Spec Grav, UA 1.010    Blood, UA neg    pH, UA 7.0    Protein, UA neg    Urobilinogen, UA negative    Nitrite, UA neg    Leukocytes, UA Negative Negative   Patient was informed of the urinalysis result before he left the office.     Assessment & Plan:  1. Annual physical exam -The patient will check with his insurance regarding the Prevnar vaccine and the shingles shot and he refuses to do a flu shot. - POCT UA - Microscopic Only - POCT urinalysis dipstick - BMP8+EGFR - CBC with Differential/Platelet - Hepatic function panel - PSA, total and free - VITAMIN D 25 Hydroxy (Vit-D Deficiency, Fractures) - Thyroid Panel  With TSH - NMR, lipoprofile  2. BPH (benign prostatic hyperplasia) -The prostate was minimally enlarged if any and there were no lumps or masses found in this. He is not having any urinary tract symptoms. - POCT UA - Microscopic Only - POCT urinalysis dipstick - CBC with Differential/Platelet - PSA, total and free  3. Hyperlipidemia -The patient is statin intolerant he will have to continue with aggressive therapeutic lifestyle changes to include diet and exercise - BMP8+EGFR - CBC with Differential/Platelet - Hepatic function panel - NMR, lipoprofile  4. Vitamin D deficiency -He is currently not taking any vitamin D and we will review this once the lab work is returned - CBC with Differential/Platelet - VITAMIN D 25 Hydroxy (Vit-D Deficiency, Fractures)  5. Erectile dysfunction due to arterial insufficiency -He is taking Staxyn  for his erectile dysfunction and this is working well.  6. DDD (degenerative disc disease), cervical -He takes Voltaren diclofenac for his periodic problems with his neck and neuropathy secondary to this.  Meds ordered this encounter  Medications  . diclofenac (VOLTAREN) 75 MG EC tablet    Sig: TAKE 1 TABLET BY MOUTH TWICE A DAY    Dispense:  60 tablet    Refill:  6  . Vardenafil HCl (STAXYN) 10 MG TBDP    Sig: TAKE 1 TABLET BY MOUTH AS NEEDED AS DIRECTED    Dispense:  30 tablet    Refill:  6   Patient Instructions  Continue current medications. Continue good therapeutic lifestyle changes which include good diet and exercise. Fall precautions discussed with patient. If an FOBT was given today- please return it to our front desk. If you are over 47 years old - you may need Prevnar 93 or the adult Pneumonia vaccine.  **Flu shots are available--- please call and schedule a FLU-CLINIC appointment**  After your visit with [place] today you will receive a survey in the mail or online from [employer] regarding your care with [place]. Please take a moment to  fill this out. Your feedback is very important to [place] as you can help [place] better understand your patient needs as well as improve your experience and satisfaction. WE CARE ABOUT YOU!!!   The patient should follow-up with the gastroenterologist as planned for a repeat colonoscopy He should return his FOBT in March This winter he should drink plenty of fluids and stay well hydrated He should check with his insurance regarding the Prevnar vaccine and the shingles shot He should continue to stay active physically but at the same time being careful not to fall and injure his neck   Arrie Senate MD

## 2015-01-03 ENCOUNTER — Other Ambulatory Visit: Payer: Self-pay | Admitting: *Deleted

## 2015-01-03 LAB — CBC WITH DIFFERENTIAL/PLATELET
BASOS ABS: 0 10*3/uL (ref 0.0–0.2)
Basos: 1 %
EOS (ABSOLUTE): 0.2 10*3/uL (ref 0.0–0.4)
Eos: 3 %
Hematocrit: 44.8 % (ref 37.5–51.0)
Hemoglobin: 14.8 g/dL (ref 12.6–17.7)
IMMATURE GRANS (ABS): 0 10*3/uL (ref 0.0–0.1)
Immature Granulocytes: 0 %
LYMPHS: 22 %
Lymphocytes Absolute: 1.5 10*3/uL (ref 0.7–3.1)
MCH: 30.8 pg (ref 26.6–33.0)
MCHC: 33 g/dL (ref 31.5–35.7)
MCV: 93 fL (ref 79–97)
MONOS ABS: 0.7 10*3/uL (ref 0.1–0.9)
Monocytes: 11 %
NEUTROS ABS: 4.1 10*3/uL (ref 1.4–7.0)
NEUTROS PCT: 63 %
PLATELETS: 289 10*3/uL (ref 150–379)
RBC: 4.81 x10E6/uL (ref 4.14–5.80)
RDW: 14.4 % (ref 12.3–15.4)
WBC: 6.5 10*3/uL (ref 3.4–10.8)

## 2015-01-03 LAB — BMP8+EGFR
BUN/Creatinine Ratio: 13 (ref 9–20)
BUN: 13 mg/dL (ref 6–24)
CALCIUM: 9.8 mg/dL (ref 8.7–10.2)
CO2: 23 mmol/L (ref 18–29)
CREATININE: 0.98 mg/dL (ref 0.76–1.27)
Chloride: 98 mmol/L (ref 96–106)
GFR calc Af Amer: 103 mL/min/{1.73_m2} (ref >59)
GFR, EST NON AFRICAN AMERICAN: 89 mL/min/{1.73_m2} (ref >59)
Glucose: 95 mg/dL (ref 65–99)
Potassium: 4.4 mmol/L (ref 3.5–5.2)
Sodium: 138 mmol/L (ref 134–144)

## 2015-01-03 LAB — NMR, LIPOPROFILE
CHOLESTEROL: 234 mg/dL — AB (ref 100–199)
HDL CHOLESTEROL BY NMR: 50 mg/dL (ref >39)
HDL PARTICLE NUMBER: 40.1 umol/L (ref >=30.5)
LDL Particle Number: 1696 nmol/L — ABNORMAL HIGH (ref <1000)
LDL SIZE: 19.8 nm (ref >20.5)
LDL-C: 137 mg/dL — ABNORMAL HIGH (ref 0–99)
LP-IR Score: 77 — ABNORMAL HIGH (ref <=45)
SMALL LDL PARTICLE NUMBER: 1128 nmol/L — AB (ref <=527)
TRIGLYCERIDES BY NMR: 235 mg/dL — AB (ref 0–149)

## 2015-01-03 LAB — THYROID PANEL WITH TSH
Free Thyroxine Index: 2.4 (ref 1.2–4.9)
T3 Uptake Ratio: 31 % (ref 24–39)
T4, Total: 7.9 ug/dL (ref 4.5–12.0)
TSH: 1.54 u[IU]/mL (ref 0.450–4.500)

## 2015-01-03 LAB — VITAMIN D 25 HYDROXY (VIT D DEFICIENCY, FRACTURES): VIT D 25 HYDROXY: 23.4 ng/mL — AB (ref 30.0–100.0)

## 2015-01-03 LAB — HEPATIC FUNCTION PANEL
ALT: 34 IU/L (ref 0–44)
AST: 25 IU/L (ref 0–40)
Albumin: 4.4 g/dL (ref 3.5–5.5)
Alkaline Phosphatase: 66 IU/L (ref 39–117)
BILIRUBIN, DIRECT: 0.13 mg/dL (ref 0.00–0.40)
Bilirubin Total: 0.4 mg/dL (ref 0.0–1.2)
TOTAL PROTEIN: 7.1 g/dL (ref 6.0–8.5)

## 2015-01-03 LAB — PSA, TOTAL AND FREE
PROSTATE SPECIFIC AG, SERUM: 0.5 ng/mL (ref 0.0–4.0)
PSA FREE PCT: 32 %
PSA FREE: 0.16 ng/mL

## 2015-01-03 MED ORDER — VITAMIN D (ERGOCALCIFEROL) 1.25 MG (50000 UNIT) PO CAPS: 50000.0000 [IU] | ORAL_CAPSULE | ORAL | Status: DC

## 2015-02-13 ENCOUNTER — Ambulatory Visit: Payer: Self-pay | Admitting: Pharmacist

## 2015-02-14 ENCOUNTER — Encounter: Payer: Self-pay | Admitting: Family Medicine

## 2015-02-23 IMAGING — CT CT ABD-PELV W/O CM
2 of 4 series · 17 of 46 positions shown, 19 images · non-contrast
Comparison: None.

CLINICAL DATA: Abdominal pain

CT ABDOMEN AND PELVIS WITHOUT CONTRAST
TECHNIQUE: Multidetector CT imaging of the abdomen and pelvis was
performed following the standard protocol without intravenous
contrast.

[Series 2: abdomen/pelvis w/o contrast · axial · non-contrast · 0.75mm/px · z∈[-442,-22]mm · 14 of 98 slices shown, 16 images]
[im 7/98  soft-tissue]
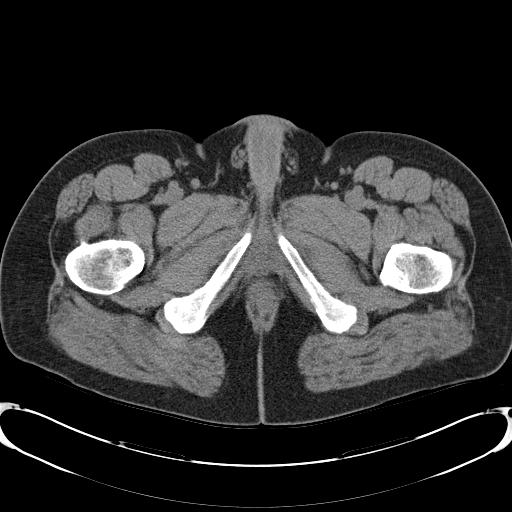
[im 7/98  bone]
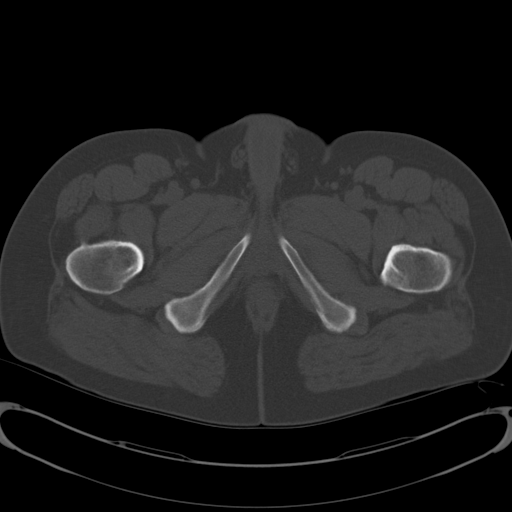
[im 13/98  soft-tissue]
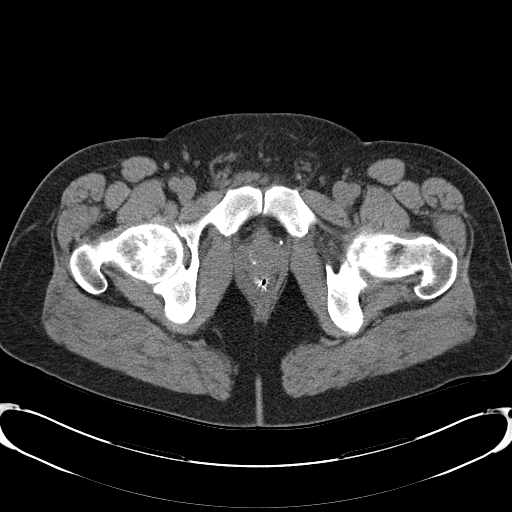
[im 20/98  soft-tissue]
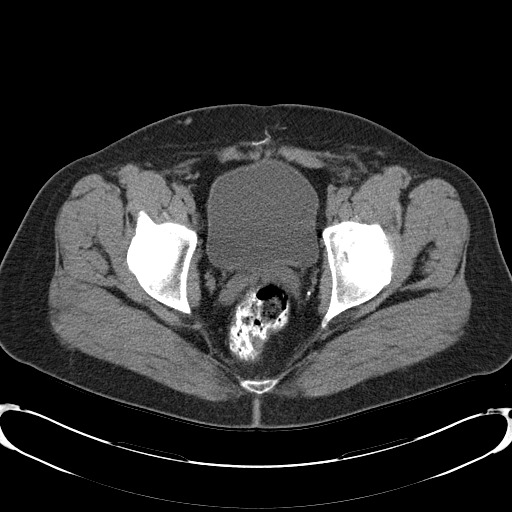
[im 26/98  soft-tissue]
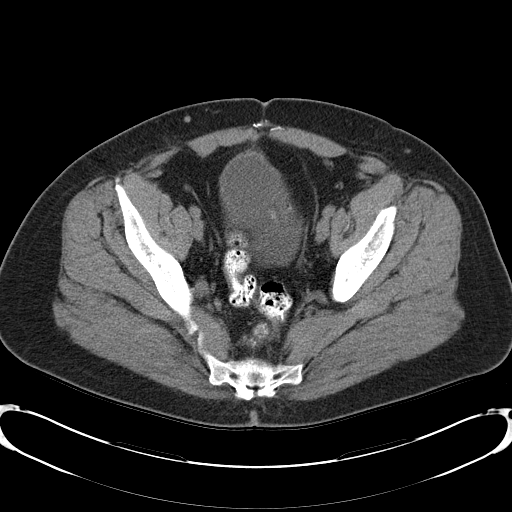
[im 33/98  soft-tissue]
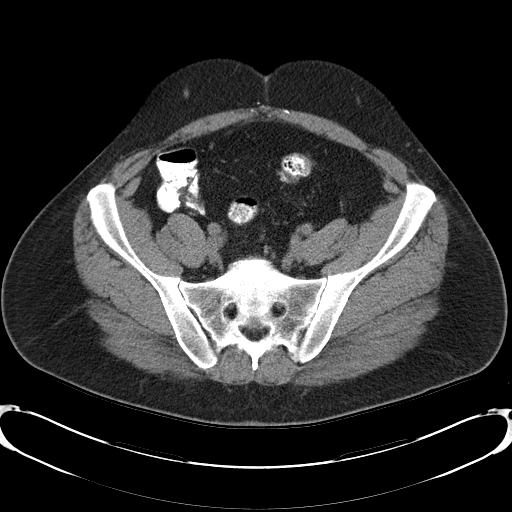
[im 39/98  soft-tissue]
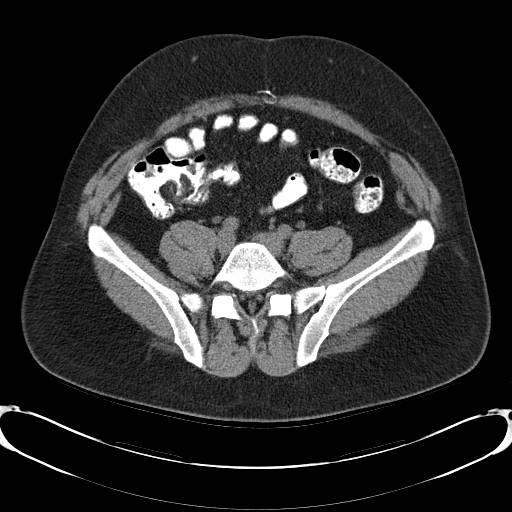
[im 46/98  soft-tissue]
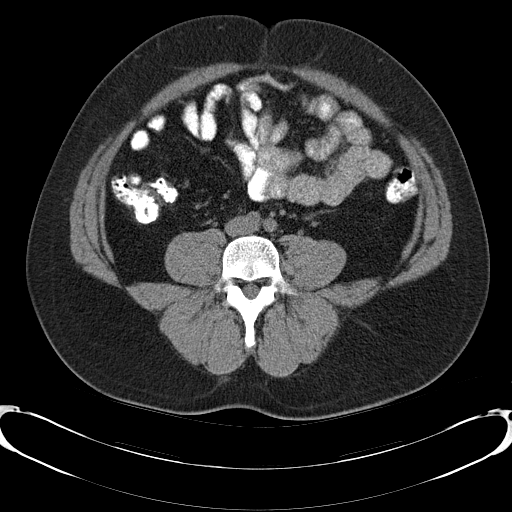
[im 52/98  soft-tissue]
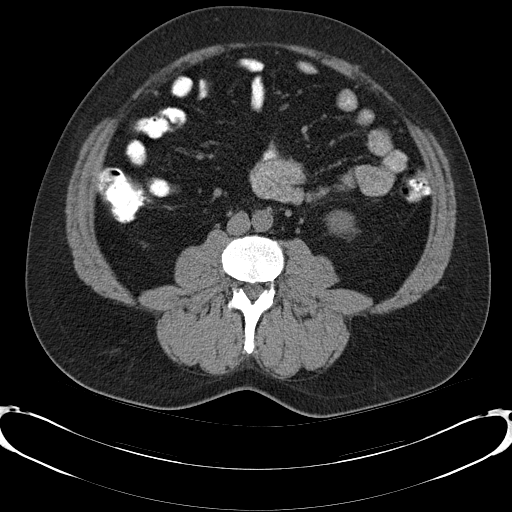
[im 59/98  soft-tissue]
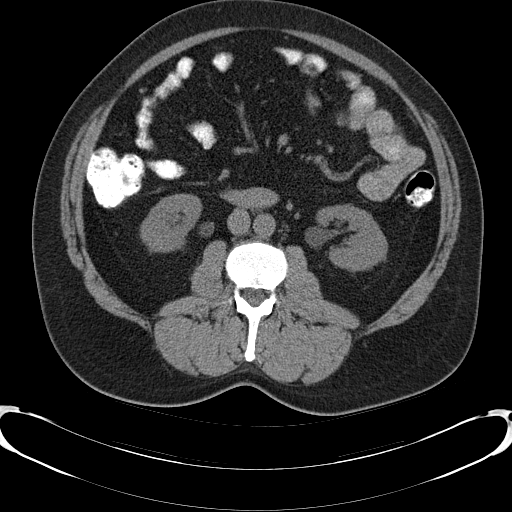
[im 59/98  bone]
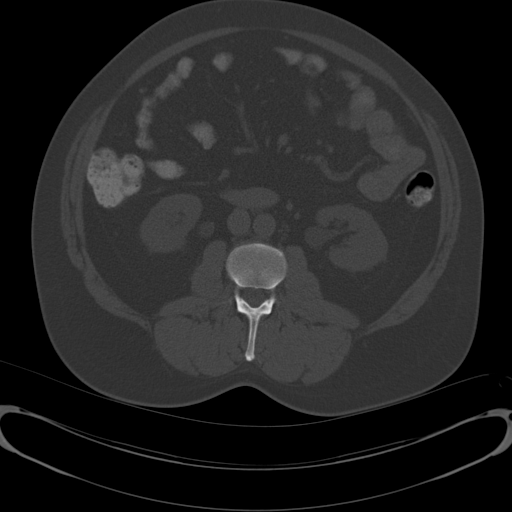
[im 65/98  soft-tissue]
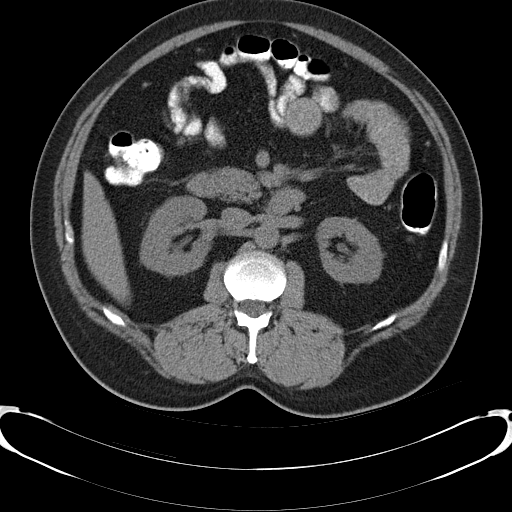
[im 72/98  soft-tissue]
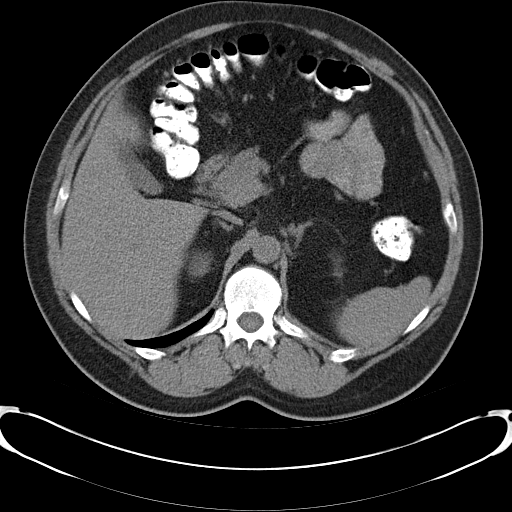
[im 78/98  soft-tissue]
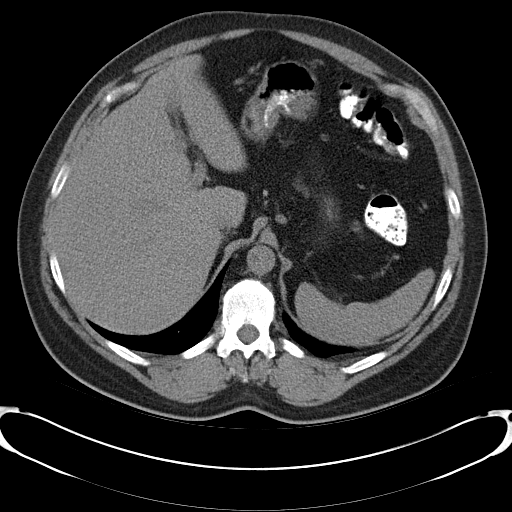
[im 85/98  soft-tissue]
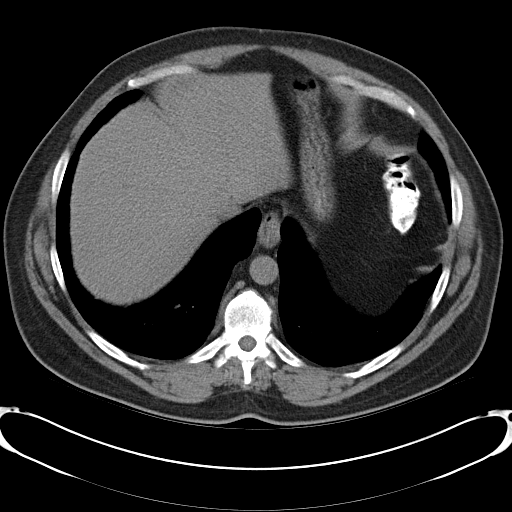
[im 91/98  soft-tissue]
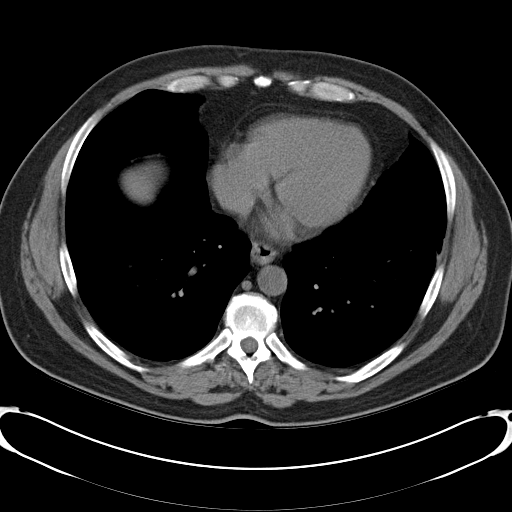

[Series 4: mpr cor (id) · coronal · 0.78mm/px · 3 of 92 slices shown]
[im 31/92  soft-tissue]
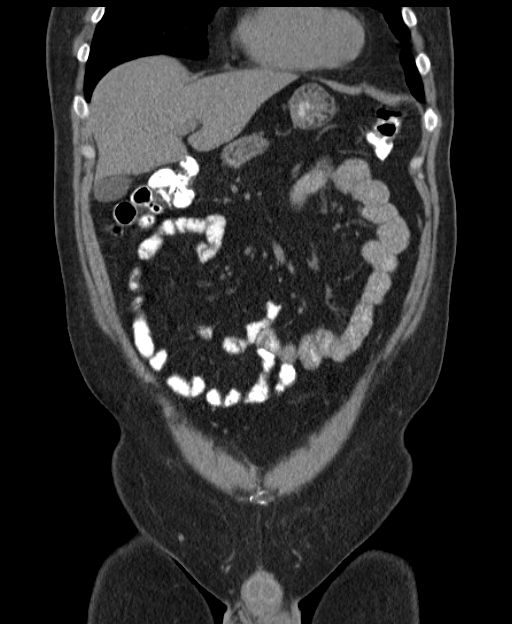
[im 41/92  soft-tissue]
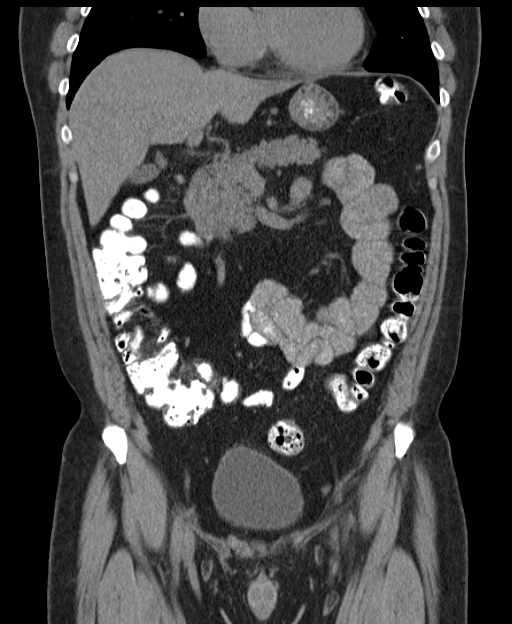
[im 51/92  soft-tissue]
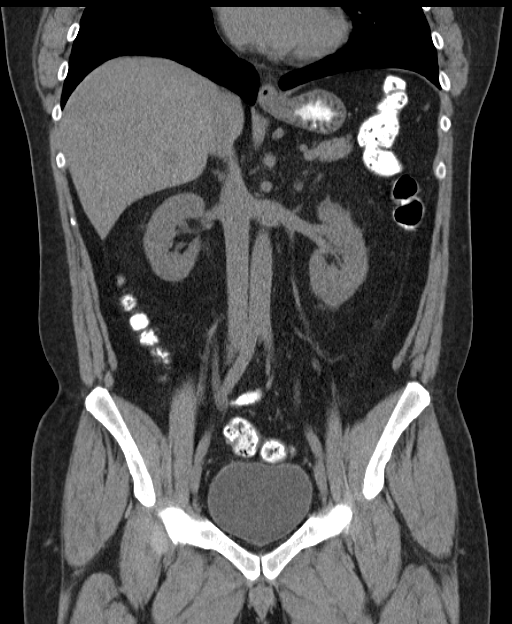

[17 of 46 positions shown; findings below may reference images not displayed]

FINDINGS: Visualized lung bases appear normal.  No focal
abnormalities seen involving the liver, spleen or pancreas.  No
gallstones are noted.  Adrenal glands and kidneys appear normal.
No hydronephrosis or renal obstruction is noted.  No renal or
ureteral calculi are noted.  Urinary bladder appears normal.
Appendix is not visualized although no inflammation is seen in the
right lower quadrant.  No gross bowel wall abnormality is noted.
No abnormal fluid collection is noted.  No adenopathy is noted. No
osseous abnormality is noted.
IMPRESSION: No significant abnormality seen in the abdomen or pelvis.

## 2016-01-03 ENCOUNTER — Ambulatory Visit: Payer: PRIVATE HEALTH INSURANCE | Admitting: Family Medicine

## 2016-01-24 ENCOUNTER — Other Ambulatory Visit: Payer: Self-pay | Admitting: Family Medicine

## 2016-02-10 ENCOUNTER — Telehealth: Payer: Self-pay | Admitting: Family Medicine

## 2016-02-11 MED ORDER — DICLOFENAC SODIUM 75 MG PO TBEC: DELAYED_RELEASE_TABLET | ORAL | 11 refills | Status: DC

## 2016-02-11 MED ORDER — VARDENAFIL HCL 10 MG PO TBDP: ORAL_TABLET | ORAL | 11 refills | Status: DC

## 2016-02-11 NOTE — Telephone Encounter (Signed)
APt made  - we will refill meds until appt

## 2016-03-26 ENCOUNTER — Telehealth: Payer: Self-pay | Admitting: Family Medicine

## 2016-03-27 NOTE — Telephone Encounter (Signed)
Attempted to schedule appt for pt with another provider Pt did not want to schedule at this time Will call back if sxs persist or worsen

## 2016-04-14 ENCOUNTER — Ambulatory Visit (INDEPENDENT_AMBULATORY_CARE_PROVIDER_SITE_OTHER): Payer: PRIVATE HEALTH INSURANCE | Admitting: Family Medicine

## 2016-04-14 ENCOUNTER — Encounter: Payer: Self-pay | Admitting: Family Medicine

## 2016-04-14 ENCOUNTER — Other Ambulatory Visit: Payer: Self-pay | Admitting: Family Medicine

## 2016-04-14 ENCOUNTER — Ambulatory Visit (INDEPENDENT_AMBULATORY_CARE_PROVIDER_SITE_OTHER): Payer: PRIVATE HEALTH INSURANCE

## 2016-04-14 VITALS — BP 118/80 | HR 88 | Temp 98.4°F | Ht 68.0 in | Wt 186.0 lb

## 2016-04-14 DIAGNOSIS — E559 Vitamin D deficiency, unspecified: Secondary | ICD-10-CM

## 2016-04-14 DIAGNOSIS — Z0000 Encounter for general adult medical examination without abnormal findings: Secondary | ICD-10-CM | POA: Diagnosis not present

## 2016-04-14 DIAGNOSIS — E7800 Pure hypercholesterolemia, unspecified: Secondary | ICD-10-CM

## 2016-04-14 DIAGNOSIS — K439 Ventral hernia without obstruction or gangrene: Secondary | ICD-10-CM

## 2016-04-14 DIAGNOSIS — N400 Enlarged prostate without lower urinary tract symptoms: Secondary | ICD-10-CM

## 2016-04-14 DIAGNOSIS — K429 Umbilical hernia without obstruction or gangrene: Secondary | ICD-10-CM

## 2016-04-14 DIAGNOSIS — Z86010 Personal history of colonic polyps: Secondary | ICD-10-CM

## 2016-04-14 DIAGNOSIS — N5201 Erectile dysfunction due to arterial insufficiency: Secondary | ICD-10-CM

## 2016-04-14 LAB — URINALYSIS, COMPLETE
Bilirubin, UA: NEGATIVE
Glucose, UA: NEGATIVE
Ketones, UA: NEGATIVE
Leukocytes, UA: NEGATIVE
NITRITE UA: NEGATIVE
Protein, UA: NEGATIVE
RBC, UA: NEGATIVE
Specific Gravity, UA: 1.015 (ref 1.005–1.030)
Urobilinogen, Ur: 0.2 mg/dL (ref 0.2–1.0)
pH, UA: 7 (ref 5.0–7.5)

## 2016-04-14 LAB — MICROSCOPIC EXAMINATION
BACTERIA UA: NONE SEEN
RBC MICROSCOPIC, UA: NONE SEEN /HPF (ref 0–?)
Renal Epithel, UA: NONE SEEN /hpf
WBC, UA: NONE SEEN /hpf (ref 0–?)

## 2016-04-14 NOTE — Progress Notes (Signed)
Subjective:    Patient ID: Christopher Rosales, male    DOB: 01-23-1965, 51 y.o.   MRN: [id number]  HPI Patient is here today for annual wellness exam and follow up of chronic medical problems which includes hyperlipidemia.This patient is complaining of some occasional abdominal discomfort. He also complains of some low back pain and has a history of cervical degenerative disc disease. He will get a chest x-ray today he will be given an FOBT to return. He will also get a rectal exam and EKG today. He did have a colonoscopy done in March 2016. At that time he did have adenomatous polyps which were precancerous in nature and was told that he would need to repeat his colonoscopy in 3 years. This would mean he would need another colonoscopy in March 2019. He had LS spine films done in July 2014 and there was no significant degenerative changes noted at that time. It last chest x-ray was done in February 2016 and it was unremarkable. This patient is adopted. As he is not certain of his 2 parents health issues. The patient has also had some problems with erectile dysfunction in the past. The patient admits to some abdominal discomfort and bloating without heartburn or indigestion about 3 weeks ago and he is not sure what initiated this. He does take the Voltaren but only takes a couple times a week for his back and neck pain. He does drink some alcohol occasionally. The abdominal pain and bloating are better. He is due as mentioned a repeat colonoscopy next spring. He denies any chest pain or shortness of breath. He denies any heartburn blood in the stool or black tarry bowel movements. He is passing his water without problems. 1 fact of note is that he was adopted but he has found out that his father died of lung cancer. He is not sure about the medical history of his true mother at this point.    Patient Active Problem List   Diagnosis Date Noted  . BPH (benign prostatic hyperplasia) 11/08/2013  . Vitamin D  deficiency 11/08/2013  . DDD (degenerative disc disease), cervical 11/08/2013  . Erectile dysfunction 11/08/2013  . Peripheral neuropathy ([place]) 11/08/2013  . Hyperlipidemia 07/21/2012  . Statin intolerance 07/21/2012   Outpatient Encounter Prescriptions as of 04/14/2016  Medication Sig  . diclofenac (VOLTAREN) 75 MG EC tablet TAKE 1 TABLET BY MOUTH TWICE A DAY  . Vardenafil HCl (STAXYN) 10 MG TBDP TAKE 1 TABLET BY MOUTH AS NEEDED AS DIRECTED  . Vitamin D, Ergocalciferol, (DRISDOL) 50000 UNITS CAPS capsule Take 1 capsule (50,000 Units total) by mouth every 7 (seven) days.   No facility-administered encounter medications on file as of 04/14/2016.      Review of Systems  Constitutional: Negative.   HENT: Negative.   Eyes: Negative.   Respiratory: Negative.   Cardiovascular: Negative.   Gastrointestinal: Negative.        Occasional abdominal discomfort and low back pain   Endocrine: Negative.   Genitourinary: Negative.   Musculoskeletal: Negative.   Skin: Negative.   Allergic/Immunologic: Negative.   Neurological: Negative.   Hematological: Negative.   Psychiatric/Behavioral: Negative.        Objective:   Physical Exam  Constitutional: He is oriented to person, place, and time. He appears well-developed and well-nourished. No distress.  The patient is pleasant and cooperative and somewhat anxious but this is normal for him.  HENT:  Head: Normocephalic and atraumatic.  Right Ear: External ear normal.  Left  Ear: External ear normal.  Nose: Nose normal.  Mouth/Throat: Oropharynx is clear and moist. No oropharyngeal exudate.  Eyes: Conjunctivae and EOM are normal. Pupils are equal, round, and reactive to light. Right eye exhibits no discharge. Left eye exhibits no discharge. No scleral icterus.  Neck: Normal range of motion. Neck supple. No thyromegaly present.  No bruits thyromegaly or anterior cervical adenopathy  Cardiovascular: Normal rate, regular rhythm, normal heart  sounds and intact distal pulses.  Exam reveals no friction rub.   No murmur heard. The heart is 84/m with a regular rate and rhythm  Pulmonary/Chest: Effort normal and breath sounds normal. No respiratory distress. He has no wheezes. He has no rales. He exhibits no tenderness.  Clear anteriorly and posteriorly and no axillary adenopathy  Abdominal: Soft. Bowel sounds are normal. He exhibits no mass. There is tenderness. There is no rebound and no guarding.  Slight periumbilical tenderness Large umbilical hernia with scarring beneath the hernia secondary to appendicitis surgery.  Genitourinary: Rectum normal and penis normal.  Genitourinary Comments: The prostate is slightly enlarged with no lumps or masses. The rectal exam was negative. The external genitalia were within normal limits and no inguinal hernias were palpable.  Musculoskeletal: Normal range of motion. He exhibits no edema.  Good range of motion of upper extremities and neck without pain.  Lymphadenopathy:    He has no cervical adenopathy.  Neurological: He is alert and oriented to person, place, and time. He has normal reflexes. No cranial nerve deficit.  Skin: Skin is warm and dry. No rash noted.  Psychiatric: He has a normal mood and affect. His behavior is normal. Judgment and thought content normal.  Nursing note and vitals reviewed.  BP 118/80 (BP Location: Left Arm)   Pulse 88   Temp 98.4 F (36.9 C) (Oral)   Ht 5' 8"  (1.727 m)   Wt 186 lb (84.4 kg)   BMI 28.28 kg/m   EKG and chest x-ray with results pending====      Assessment & Plan:  1. Annual physical exam -The plan will be to repeat his colonoscopy in 1 year. -We will make a referral to a general surgeon to further evaluate his abdominal pain which could be secondary to his umbilical hernia. It does appear to be larger than on previous visits. - CBC with Differential/Platelet - BMP8+EGFR - Hepatic function panel - VITAMIN D 25 Hydroxy (Vit-D Deficiency,  Fractures) - NMR, lipoprofile - PSA, total and free - Urinalysis, Complete - EKG 12-Lead  2. Benign prostatic hyperplasia, unspecified whether lower urinary tract symptoms present -No symptoms with prostate. - CBC with Differential/Platelet - PSA, total and free - Urinalysis, Complete  3. Pure hypercholesterolemia -The patient is statin intolerant and must continue with aggressive therapeutic lifestyle changes - CBC with Differential/Platelet - BMP8+EGFR - Hepatic function panel - NMR, lipoprofile - EKG 12-Lead  4. Vitamin D deficiency -Continue with vitamin D replacement pending results of lab work - CBC with Differential/Platelet - VITAMIN D 25 Hydroxy (Vit-D Deficiency, Fractures)  5. Hx of adenomatous colonic polyps -Follow-up with gastroenterologist as planned in about 1 year for repeat colonoscopy  6. Hernia of abdominal wall - Ambulatory referral to General Surgery  7. Umbilical hernia without obstruction and without gangrene -See surgeon for evaluation as planned  8. Erectile dysfunction due to arterial insufficiency -Continue with Staxyn  Patient Instructions  Continue current medications. Continue good therapeutic lifestyle changes which include good diet and exercise. Fall precautions discussed with patient. If an  FOBT was given today- please return it to our front desk. If you are over 22 years old - you may need Prevnar 67 or the adult Pneumonia vaccine.  **Flu shots are available--- please call and schedule a FLU-CLINIC appointment**  After your visit with [place] today you will receive a survey in the mail or online from [employer] regarding your care with [place]. Please take a moment to fill this out. Your feedback is very important to [place] as you can help [place] better understand your patient needs as well as improve your experience and satisfaction. WE CARE ABOUT YOU!!!   We will arrange for the patient to see a general surgeon in [place] because of the  umbilical hernia and his abdominal discomfort and bloating that he's been having recently. He understands that he will need another colonoscopy in 1 year. This is due to precancerous polyps that were found 2 years ago.  Arrie Senate MD

## 2016-04-14 NOTE — Patient Instructions (Addendum)
Continue current medications. Continue good therapeutic lifestyle changes which include good diet and exercise. Fall precautions discussed with patient. If an FOBT was given today- please return it to our front desk. If you are over 53 years old - you may need Prevnar 13 or the adult Pneumonia vaccine.  **Flu shots are available--- please call and schedule a FLU-CLINIC appointment**  After your visit with us today you will receive a survey in the mail or online from [employer] regarding your care with us. Please take a moment to fill this out. Your feedback is very important to us as you can help us better understand your patient needs as well as improve your experience and satisfaction. WE CARE ABOUT YOU!!!   We will arrange for the patient to see a general surgeon in [place] because of the umbilical hernia and his abdominal discomfort and bloating that he's been having recently. He understands that he will need another colonoscopy in 1 year. This is due to precancerous polyps that were found 2 years ago.

## 2016-04-15 LAB — BMP8+EGFR
BUN / CREAT RATIO: 10 (ref 9–20)
BUN: 11 mg/dL (ref 6–24)
CHLORIDE: 97 mmol/L (ref 96–106)
CO2: 24 mmol/L (ref 18–29)
Calcium: 9.8 mg/dL (ref 8.7–10.2)
Creatinine, Ser: 1.05 mg/dL (ref 0.76–1.27)
GFR calc Af Amer: 94 mL/min/{1.73_m2} (ref >59)
GFR calc non Af Amer: 81 mL/min/{1.73_m2} (ref >59)
GLUCOSE: 96 mg/dL (ref 65–99)
POTASSIUM: 4.4 mmol/L (ref 3.5–5.2)
SODIUM: 139 mmol/L (ref 134–144)

## 2016-04-15 LAB — NMR, LIPOPROFILE
Cholesterol: 218 mg/dL — ABNORMAL HIGH (ref 100–199)
HDL Cholesterol by NMR: 53 mg/dL (ref >39)
HDL Particle Number: 43.4 umol/L (ref >=30.5)
LDL PARTICLE NUMBER: 1442 nmol/L — AB (ref <1000)
LDL SIZE: 19.6 nm (ref >20.5)
LDL-C: 96 mg/dL (ref 0–99)
LP-IR SCORE: 70 — AB (ref <=45)
SMALL LDL PARTICLE NUMBER: 895 nmol/L — AB (ref <=527)
TRIGLYCERIDES BY NMR: 343 mg/dL — AB (ref 0–149)

## 2016-04-15 LAB — CBC WITH DIFFERENTIAL/PLATELET
BASOS ABS: 0 10*3/uL (ref 0.0–0.2)
Basos: 0 %
EOS (ABSOLUTE): 0.3 10*3/uL (ref 0.0–0.4)
Eos: 3 %
HEMOGLOBIN: 15.1 g/dL (ref 13.0–17.7)
Hematocrit: 44 % (ref 37.5–51.0)
Immature Grans (Abs): 0 10*3/uL (ref 0.0–0.1)
Immature Granulocytes: 0 %
LYMPHS ABS: 2 10*3/uL (ref 0.7–3.1)
LYMPHS: 24 %
MCH: 31.4 pg (ref 26.6–33.0)
MCHC: 34.3 g/dL (ref 31.5–35.7)
MCV: 92 fL (ref 79–97)
MONOCYTES: 15 %
Monocytes Absolute: 1.2 10*3/uL — ABNORMAL HIGH (ref 0.1–0.9)
NEUTROS PCT: 58 %
Neutrophils Absolute: 4.7 10*3/uL (ref 1.4–7.0)
Platelets: 281 10*3/uL (ref 150–379)
RBC: 4.81 x10E6/uL (ref 4.14–5.80)
RDW: 14.4 % (ref 12.3–15.4)
WBC: 8.2 10*3/uL (ref 3.4–10.8)

## 2016-04-15 LAB — PSA, TOTAL AND FREE
PROSTATE SPECIFIC AG, SERUM: 0.5 ng/mL (ref 0.0–4.0)
PSA, Free Pct: 26 %
PSA, Free: 0.13 ng/mL

## 2016-04-15 LAB — HEPATIC FUNCTION PANEL
ALK PHOS: 73 IU/L (ref 39–117)
ALT: 28 IU/L (ref 0–44)
AST: 22 IU/L (ref 0–40)
Albumin: 4.5 g/dL (ref 3.5–5.5)
Bilirubin Total: 0.5 mg/dL (ref 0.0–1.2)
Bilirubin, Direct: 0.15 mg/dL (ref 0.00–0.40)
TOTAL PROTEIN: 7.6 g/dL (ref 6.0–8.5)

## 2016-04-15 LAB — VITAMIN D 25 HYDROXY (VIT D DEFICIENCY, FRACTURES): VIT D 25 HYDROXY: 20 ng/mL — AB (ref 30.0–100.0)

## 2016-05-16 IMAGING — CR DG CHEST 2V
2 series · 2 of 2 positions shown · non-contrast
Comparison: 06/14/2003

CLINICAL DATA: Annual nearly exam.

EXAM:
CHEST  2 VIEW

[view not recorded (1 of 2)]
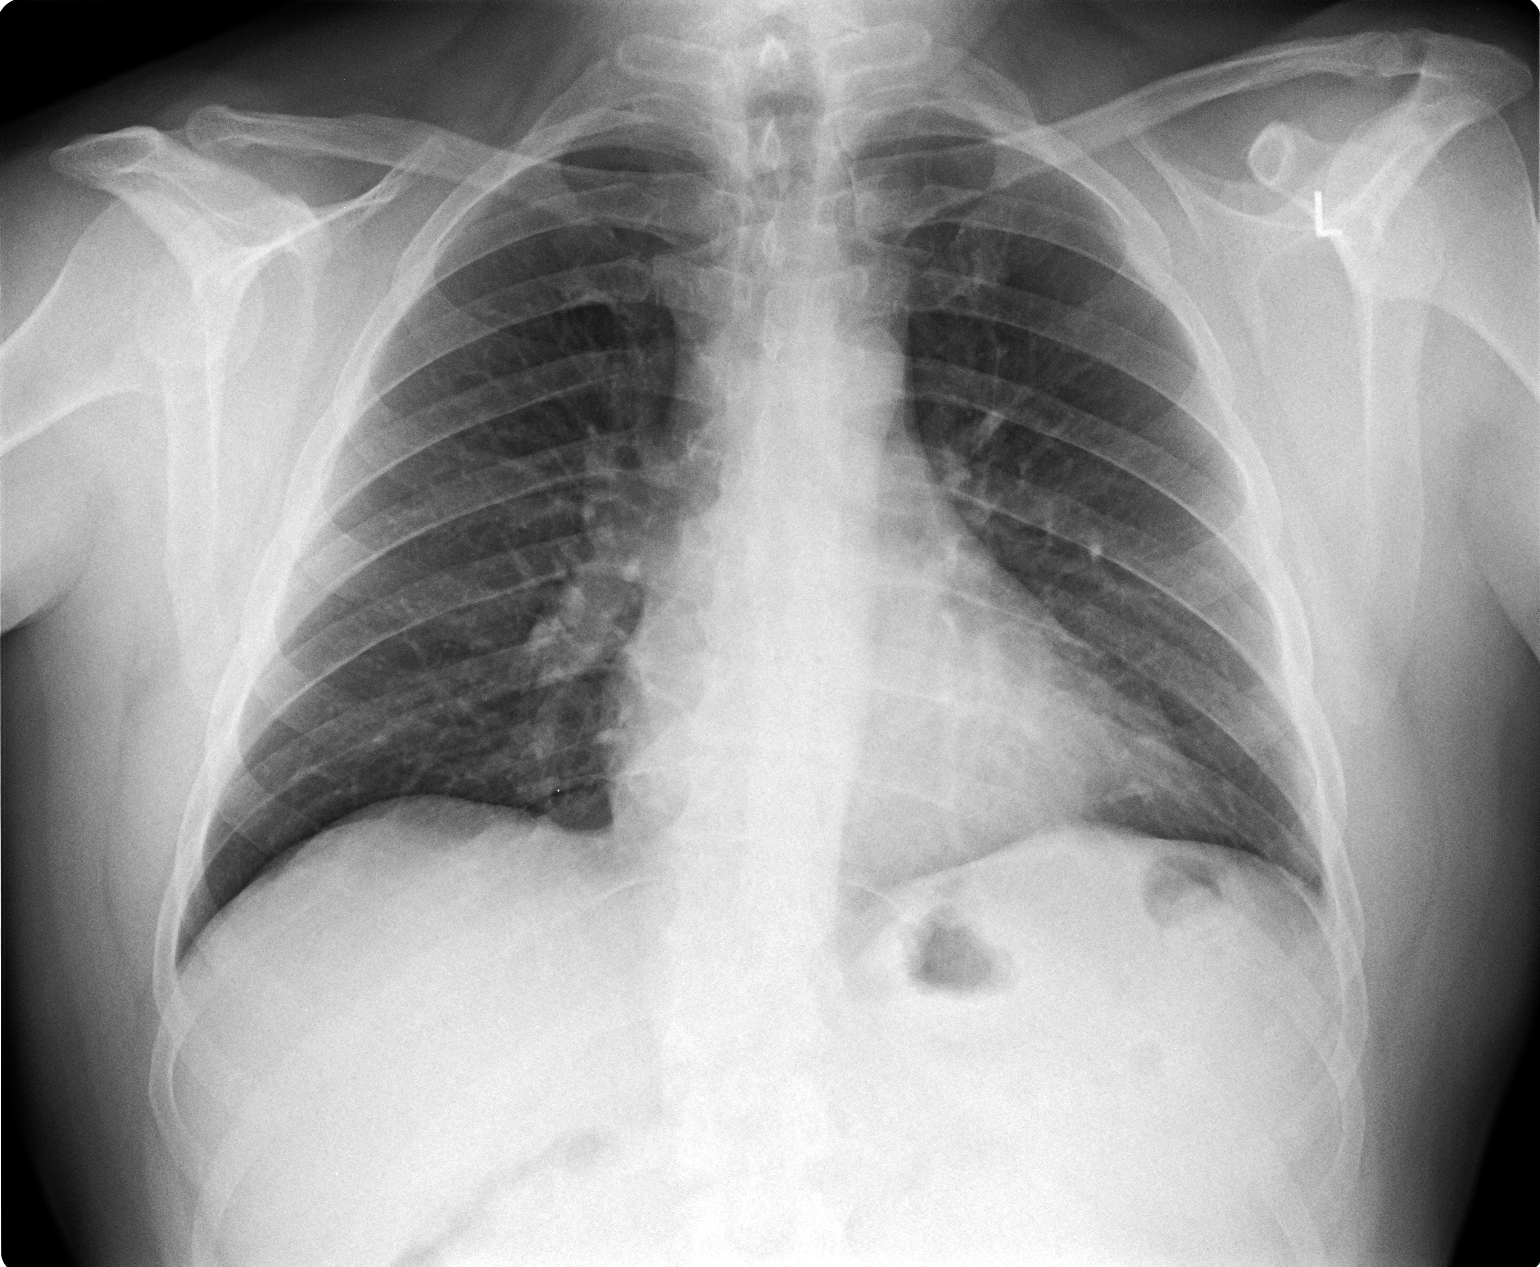

[view not recorded (2 of 2)]
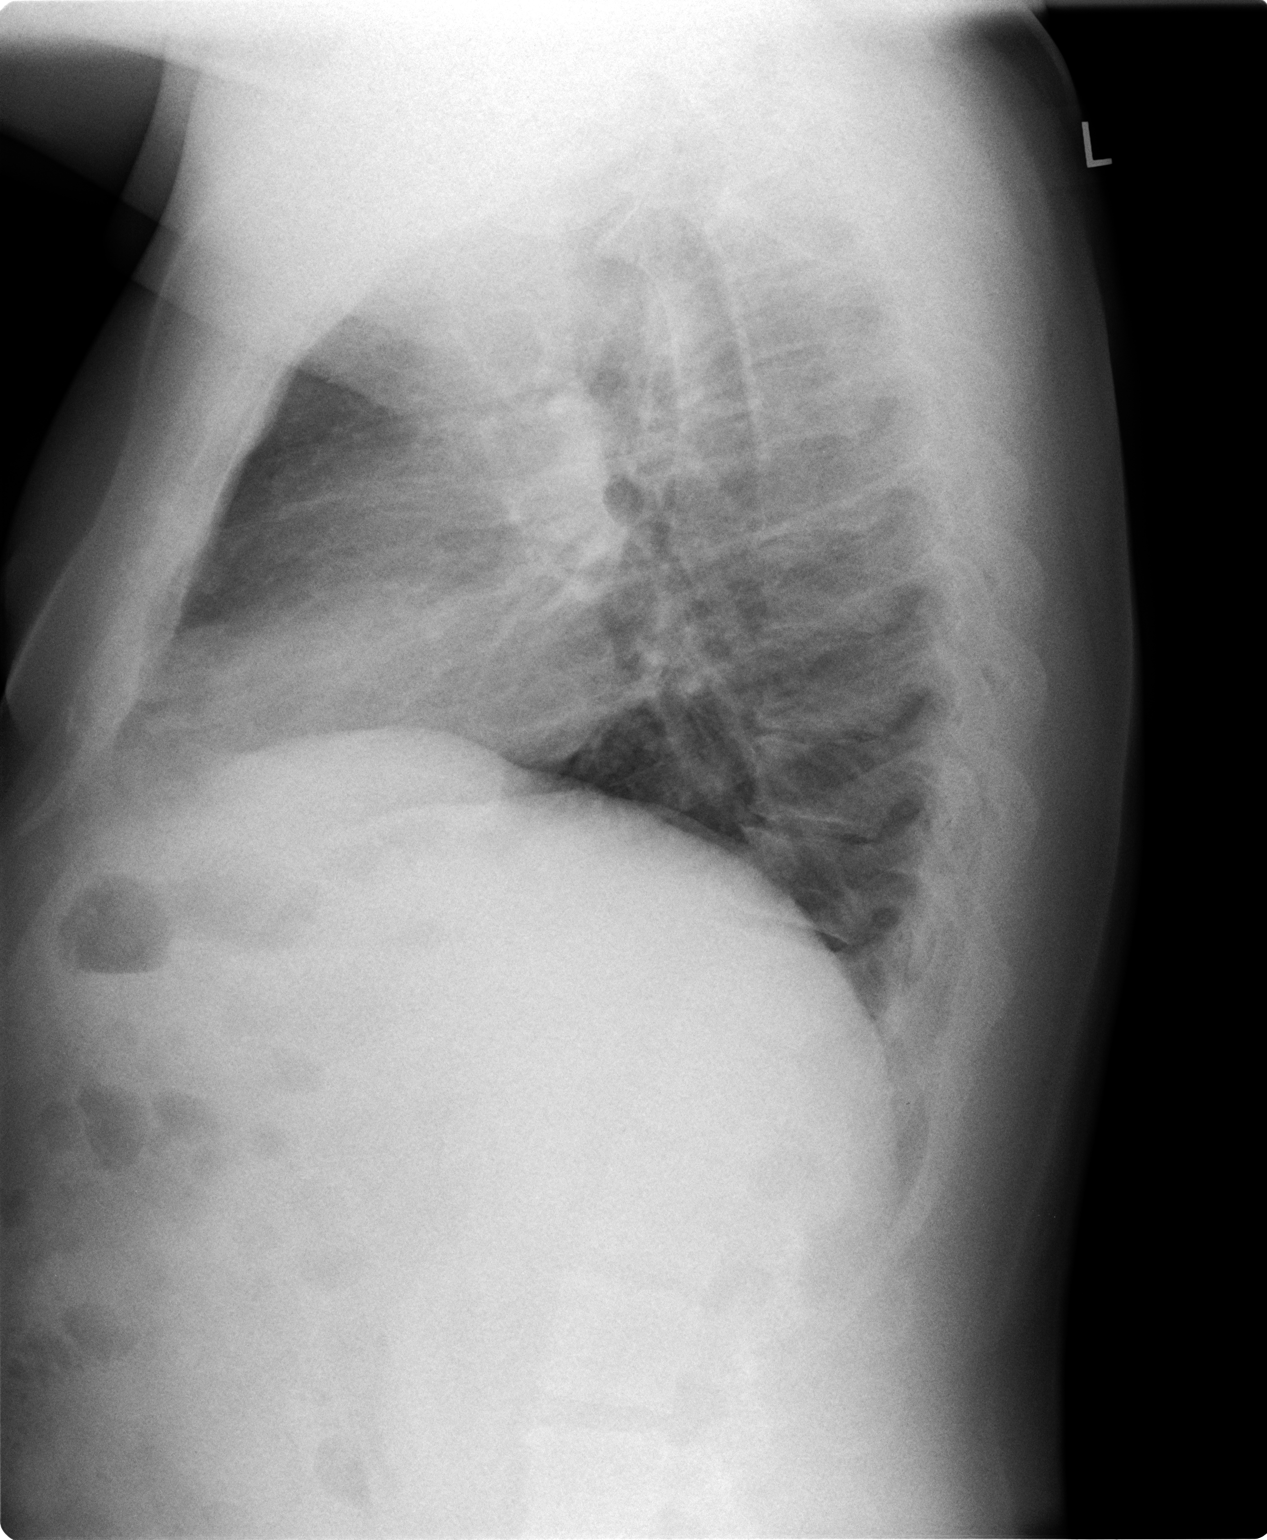

[2 of 2 positions shown; findings below may reference images not displayed]

FINDINGS: The heart size and mediastinal contours are within normal limits.
Both lungs are clear. The visualized skeletal structures are
unremarkable.
IMPRESSION: No active cardiopulmonary disease.

## 2017-04-20 ENCOUNTER — Other Ambulatory Visit: Payer: Self-pay | Admitting: Family Medicine

## 2017-04-21 ENCOUNTER — Ambulatory Visit: Payer: PRIVATE HEALTH INSURANCE | Admitting: Family Medicine

## 2017-04-21 MED ORDER — DICLOFENAC SODIUM 75 MG PO TBEC: DELAYED_RELEASE_TABLET | ORAL | 11 refills | Status: DC

## 2017-04-21 MED ORDER — VARDENAFIL HCL 10 MG PO TBDP: ORAL_TABLET | ORAL | 11 refills | Status: DC

## 2017-06-23 ENCOUNTER — Ambulatory Visit: Payer: PRIVATE HEALTH INSURANCE | Admitting: Family Medicine

## 2017-09-22 ENCOUNTER — Encounter: Payer: Self-pay | Admitting: Family Medicine

## 2017-09-22 ENCOUNTER — Ambulatory Visit (INDEPENDENT_AMBULATORY_CARE_PROVIDER_SITE_OTHER): Payer: 59 | Admitting: Family Medicine

## 2017-09-22 VITALS — BP 130/78 | HR 88 | Temp 97.6°F | Ht 68.0 in | Wt 191.0 lb

## 2017-09-22 DIAGNOSIS — E559 Vitamin D deficiency, unspecified: Secondary | ICD-10-CM

## 2017-09-22 DIAGNOSIS — N400 Enlarged prostate without lower urinary tract symptoms: Secondary | ICD-10-CM

## 2017-09-22 DIAGNOSIS — Z0000 Encounter for general adult medical examination without abnormal findings: Secondary | ICD-10-CM

## 2017-09-22 DIAGNOSIS — E7800 Pure hypercholesterolemia, unspecified: Secondary | ICD-10-CM

## 2017-09-22 DIAGNOSIS — R202 Paresthesia of skin: Secondary | ICD-10-CM

## 2017-09-22 LAB — URINALYSIS, COMPLETE
Bilirubin, UA: NEGATIVE
Glucose, UA: NEGATIVE
Ketones, UA: NEGATIVE
Leukocytes, UA: NEGATIVE
NITRITE UA: NEGATIVE
PH UA: 7.5 (ref 5.0–7.5)
Protein, UA: NEGATIVE
RBC, UA: NEGATIVE
Specific Gravity, UA: 1.02 (ref 1.005–1.030)
Urobilinogen, Ur: 0.2 mg/dL (ref 0.2–1.0)

## 2017-09-22 LAB — MICROSCOPIC EXAMINATION
BACTERIA UA: NONE SEEN
Epithelial Cells (non renal): NONE SEEN /hpf (ref 0–10)
RBC MICROSCOPIC, UA: NONE SEEN /HPF (ref 0–2)
Renal Epithel, UA: NONE SEEN /hpf
WBC UA: NONE SEEN /HPF (ref 0–5)

## 2017-09-22 MED ORDER — VARDENAFIL HCL 10 MG PO TBDP: ORAL_TABLET | ORAL | 11 refills | Status: DC

## 2017-09-22 MED ORDER — DICLOFENAC SODIUM 75 MG PO TBEC: DELAYED_RELEASE_TABLET | ORAL | 11 refills | Status: DC

## 2017-09-22 NOTE — Patient Instructions (Addendum)
Continue current medications. Continue good therapeutic lifestyle changes which include good diet and exercise. Fall precautions discussed with patient. If an FOBT was given today- please return it to our front desk. If you are over 54 years old - you may need Prevnar 13 or the adult Pneumonia vaccine.  **Flu shots are available--- please call and schedule a FLU-CLINIC appointment**  After your visit with [place] today you will receive a survey in the mail or online from [employer] regarding your care with [place]. Please take a moment to fill this out. Your feedback is very important to [place] as you can help [place] better understand your patient needs as well as improve your experience and satisfaction. WE CARE ABOUT YOU!!!   Stay active physically Drink more water Lose weight Get colonoscopy as planned Get eye exams every 2 years Get dermatology screens yearly Because of the increase umbilical discomfort with your umbilical hernia he should definitely schedule to see a general surgeon who can monitor this condition and especially if the pain gets worse. Stop using the overhead fan Try some Flonase over-the-counter 1 spray each nostril at bedtime.  If this helps the nasal congestion, then call us and we will call a prescription in for Flonase.

## 2017-09-22 NOTE — Progress Notes (Signed)
Subjective:    Patient ID: Christopher Rosales, male    DOB: Feb 21, 1965, 52 y.o.   MRN: [id number]  HPI Patient is here today for annual wellness exam and follow up of chronic medical problems which includes hyperlipidemia. He is taking medication regularly.  The patient complains of a pins-and-needles sensation in both feet at times.  He also has arthralgias.  He is due to get a physical exam today.  He is requesting refills on his Staxyn vitamin D and diclofenac.  His last colonoscopy was in March 2016.  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  He also has vitamin D deficiency.  The patient is doing well overall.  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  He denies any chest pain pressure tightness or shortness of breath anymore than usual.  He has no trouble with his stomach including nausea vomiting diarrhea blood in the stool or black tarry bowel movements.  His bowel habits have not changed.  He is passing his water without problems.  He still has some erectile dysfunction but has not been as active recently but says that the Staxyn still works for him.  His desire is not as great.  I reminded him that weight loss can help his sexual function also.  He tries to get his eye exams every 2 years.     Review of Systems  Constitutional: Negative.   HENT: Negative.   Eyes: Negative.   Respiratory: Negative.   Cardiovascular: Negative.   Gastrointestinal: Negative.   Endocrine: Negative.   Genitourinary: Negative.   Musculoskeletal: Positive for arthralgias.  Skin: Negative.        Pins/ needles - bilateral feet at times   Allergic/Immunologic: Negative.   Neurological: Negative.     Hematological: Negative.   Psychiatric/Behavioral: Negative.        Objective:   Physical Exam  Constitutional: He is oriented to person, place, and time. He appears well-developed and well-nourished. No distress.  The patient is pleasant and alert  HENT:  Head: Normocephalic and atraumatic.  Right Ear: External ear normal.  Left Ear: External ear normal.  Mouth/Throat: Oropharynx is clear and moist. No oropharyngeal exudate.  Nasal turbinate congestion  Eyes: Pupils are equal, round, and reactive to light. Conjunctivae and EOM are normal. Right eye exhibits no discharge. Left eye exhibits no discharge. No scleral icterus.  Neck: Normal range of motion. Neck supple. No thyromegaly present.  No bruits thyromegaly or anterior cervical adenopathy  Cardiovascular: Normal rate, regular rhythm, normal heart sounds and intact distal pulses.  No murmur heard. Heart is regular without murmur at 72/min with good pedal pulses.  Pulmonary/Chest: Effort normal and breath sounds normal. He has no wheezes. He has no rales. He exhibits no tenderness.  Clear anteriorly and posteriorly and no axillary adenopathy or chest wall masses.  Abdominal: Soft. Bowel sounds are normal. He exhibits no mass. There is tenderness. There is no guarding.  Minimal abdominal tenderness with $7.47 sized umbilical hernia and lower midline incision from appendicitis.  No masses organ enlargement or bruits.  Genitourinary: Rectum normal and penis normal.  Genitourinary Comments: The prostate is enlarged but soft and smooth.  There were  no rectal masses.  There were no inguinal hernias present and testicles were normal.  Musculoskeletal: Normal range of motion. He exhibits no edema.  Lymphadenopathy:    He has no cervical adenopathy.  Neurological: He is alert and oriented to person, place, and time. He has normal reflexes. No cranial nerve deficit.  Skin: Skin is warm and dry. No rash noted.  Psychiatric: He has a normal  mood and affect. His behavior is normal. Judgment and thought content normal.  The patient's mood affect and behavior were normal for him.  Nursing note and vitals reviewed.   BP 130/78 (BP Location: Left Arm)   Pulse 88   Temp 97.6 F (36.4 C) (Oral)   Ht 5' 8"  (1.727 m)   Wt 191 lb (86.6 kg)   BMI 29.04 kg/m       Assessment & Plan:  1. Annual physical exam -Get colonoscopy -Get yearly skin exams -Be sure and call the surgeon to further evaluate your umbilical hernia since you have started having some discomfort with this. - BMP8+EGFR - CBC with Differential/Platelet - Lipid panel - PSA, total and free - VITAMIN D 25 Hydroxy (Vit-D Deficiency, Fractures) - Hepatic function panel - Urinalysis, Complete  2. Benign prostatic hyperplasia, unspecified whether lower urinary tract symptoms present - CBC with Differential/Platelet - PSA, total and free - Urinalysis, Complete  3. Pure hypercholesterolemia -Continue with aggressive therapeutic lifestyle changes and weight loss - BMP8+EGFR - CBC with Differential/Platelet - Lipid panel - Hepatic function panel  4. Vitamin D deficiency -Continue with vitamin D replacement pending results of lab work - CBC with Differential/Platelet - VITAMIN D 25 Hydroxy (Vit-D Deficiency, Fractures)  5. Tingling of both feet -Take B12 level and lose weight - CBC with Differential/Platelet - BMP8+EGFR   Meds ordered this encounter  Medications  . diclofenac (VOLTAREN) 75 MG EC tablet    Sig: TAKE 1 TABLET BY MOUTH TWICE A DAY    Dispense:  60 tablet    Refill:  11  . Vardenafil HCl (STAXYN) 10 MG TBDP    Sig: TAKE 1 TABLET BY MOUTH AS NEEDED AS DIRECTED    Dispense:  30 tablet    Refill:  11   Patient Instructions  Continue current medications. Continue good therapeutic lifestyle changes which include good diet and exercise. Fall precautions discussed with patient. If an FOBT was given today- please return it to our front  desk. If you are over 65 years old - you may need Prevnar 58 or the adult Pneumonia vaccine.  **Flu shots are available--- please call and schedule a FLU-CLINIC appointment**  After your visit with [place] today you will receive a survey in the mail or online from [employer] regarding your care with [place]. Please take a moment to fill this out. Your feedback is very important to [place] as you can help [place] better understand your patient needs as well as improve your experience and satisfaction. WE CARE ABOUT YOU!!!   Stay active physically Drink more water Lose weight Get colonoscopy as planned Get eye exams every 2 years Get dermatology screens yearly Because of the increase umbilical discomfort with your umbilical hernia he should definitely schedule to see a general surgeon who can monitor this condition and especially if the pain gets worse. Stop using the overhead fan Try some Flonase over-the-counter 1 spray each nostril at bedtime.  If this helps the nasal congestion, then call us and we will call a prescription in for Flonase.  Timmothy Sours  Jennette Bill MD

## 2017-09-23 LAB — VITAMIN D 25 HYDROXY (VIT D DEFICIENCY, FRACTURES): VIT D 25 HYDROXY: 17.6 ng/mL — AB (ref 30.0–100.0)

## 2017-09-23 LAB — LIPID PANEL
CHOLESTEROL TOTAL: 218 mg/dL — AB (ref 100–199)
Chol/HDL Ratio: 4.6 ratio (ref 0.0–5.0)
HDL: 47 mg/dL (ref >39)
LDL Calculated: 114 mg/dL — ABNORMAL HIGH (ref 0–99)
Triglycerides: 284 mg/dL — ABNORMAL HIGH (ref 0–149)
VLDL Cholesterol Cal: 57 mg/dL — ABNORMAL HIGH (ref 5–40)

## 2017-09-23 LAB — BMP8+EGFR
BUN / CREAT RATIO: 13 (ref 9–20)
BUN: 14 mg/dL (ref 6–24)
CHLORIDE: 99 mmol/L (ref 96–106)
CO2: 24 mmol/L (ref 20–29)
CREATININE: 1.07 mg/dL (ref 0.76–1.27)
Calcium: 9.7 mg/dL (ref 8.7–10.2)
GFR calc non Af Amer: 78 mL/min/{1.73_m2} (ref >59)
GFR, EST AFRICAN AMERICAN: 90 mL/min/{1.73_m2} (ref >59)
GLUCOSE: 94 mg/dL (ref 65–99)
Potassium: 4.5 mmol/L (ref 3.5–5.2)
Sodium: 139 mmol/L (ref 134–144)

## 2017-09-23 LAB — CBC WITH DIFFERENTIAL/PLATELET
BASOS ABS: 0.1 10*3/uL (ref 0.0–0.2)
Basos: 1 %
EOS (ABSOLUTE): 0.2 10*3/uL (ref 0.0–0.4)
Eos: 3 %
Hematocrit: 43.7 % (ref 37.5–51.0)
Hemoglobin: 14.8 g/dL (ref 13.0–17.7)
Immature Grans (Abs): 0 10*3/uL (ref 0.0–0.1)
Immature Granulocytes: 0 %
LYMPHS ABS: 1.6 10*3/uL (ref 0.7–3.1)
Lymphs: 26 %
MCH: 31.8 pg (ref 26.6–33.0)
MCHC: 33.9 g/dL (ref 31.5–35.7)
MCV: 94 fL (ref 79–97)
MONOCYTES: 14 %
MONOS ABS: 0.9 10*3/uL (ref 0.1–0.9)
NEUTROS PCT: 56 %
Neutrophils Absolute: 3.6 10*3/uL (ref 1.4–7.0)
Platelets: 290 10*3/uL (ref 150–450)
RBC: 4.66 x10E6/uL (ref 4.14–5.80)
RDW: 14.7 % (ref 12.3–15.4)
WBC: 6.4 10*3/uL (ref 3.4–10.8)

## 2017-09-23 LAB — HEPATIC FUNCTION PANEL
ALBUMIN: 4.3 g/dL (ref 3.5–5.5)
ALT: 23 IU/L (ref 0–44)
AST: 25 IU/L (ref 0–40)
Alkaline Phosphatase: 59 IU/L (ref 39–117)
BILIRUBIN TOTAL: 0.5 mg/dL (ref 0.0–1.2)
Bilirubin, Direct: 0.12 mg/dL (ref 0.00–0.40)
TOTAL PROTEIN: 7.1 g/dL (ref 6.0–8.5)

## 2017-09-23 LAB — PSA, TOTAL AND FREE
PSA, Free Pct: 32 %
PSA, Free: 0.16 ng/mL
Prostate Specific Ag, Serum: 0.5 ng/mL (ref 0.0–4.0)

## 2017-09-24 ENCOUNTER — Other Ambulatory Visit: Payer: Self-pay

## 2017-09-24 MED ORDER — VITAMIN D (ERGOCALCIFEROL) 1.25 MG (50000 UNIT) PO CAPS: 50000.0000 [IU] | ORAL_CAPSULE | ORAL | 3 refills | Status: DC

## 2017-10-19 ENCOUNTER — Telehealth: Payer: Self-pay | Admitting: Family Medicine

## 2017-10-19 MED ORDER — ATORVASTATIN CALCIUM 10 MG PO TABS: 10.0000 mg | ORAL_TABLET | Freq: Every day | ORAL | 3 refills | Status: DC

## 2017-10-19 NOTE — Telephone Encounter (Signed)
Patient aware of labs and verbalizes understanding- rx sent in per lab.

## 2018-08-05 ENCOUNTER — Encounter: Payer: Self-pay | Admitting: *Deleted

## 2018-09-27 ENCOUNTER — Encounter: Payer: 59 | Admitting: Family Medicine

## 2018-09-27 ENCOUNTER — Ambulatory Visit: Payer: 59 | Admitting: Family Medicine

## 2018-11-14 DIAGNOSIS — M791 Myalgia, unspecified site: Secondary | ICD-10-CM | POA: Insufficient documentation

## 2019-02-02 ENCOUNTER — Ambulatory Visit: Payer: BLUE CROSS/BLUE SHIELD | Attending: Internal Medicine

## 2019-02-02 DIAGNOSIS — Z20822 Contact with and (suspected) exposure to covid-19: Secondary | ICD-10-CM

## 2019-02-03 LAB — NOVEL CORONAVIRUS, NAA: SARS-CoV-2, NAA: DETECTED — AB

## 2019-04-17 DIAGNOSIS — Z23 Encounter for immunization: Secondary | ICD-10-CM | POA: Diagnosis not present

## 2019-05-12 DIAGNOSIS — Z23 Encounter for immunization: Secondary | ICD-10-CM | POA: Diagnosis not present

## 2019-05-25 ENCOUNTER — Ambulatory Visit: Admit: 2019-05-25 | Payer: BLUE CROSS/BLUE SHIELD | Admitting: Internal Medicine

## 2019-05-25 SURGERY — COLONOSCOPY WITH PROPOFOL
Anesthesia: General

## 2019-06-01 DIAGNOSIS — L570 Actinic keratosis: Secondary | ICD-10-CM | POA: Diagnosis not present

## 2019-06-01 DIAGNOSIS — L820 Inflamed seborrheic keratosis: Secondary | ICD-10-CM | POA: Diagnosis not present

## 2019-06-01 DIAGNOSIS — L814 Other melanin hyperpigmentation: Secondary | ICD-10-CM | POA: Diagnosis not present

## 2019-06-01 DIAGNOSIS — D229 Melanocytic nevi, unspecified: Secondary | ICD-10-CM | POA: Diagnosis not present

## 2019-08-11 DIAGNOSIS — M546 Pain in thoracic spine: Secondary | ICD-10-CM | POA: Diagnosis not present

## 2019-08-11 DIAGNOSIS — K429 Umbilical hernia without obstruction or gangrene: Secondary | ICD-10-CM | POA: Diagnosis not present

## 2019-08-11 DIAGNOSIS — R140 Abdominal distension (gaseous): Secondary | ICD-10-CM | POA: Diagnosis not present

## 2019-11-16 DIAGNOSIS — E782 Mixed hyperlipidemia: Secondary | ICD-10-CM | POA: Diagnosis not present

## 2019-11-16 DIAGNOSIS — Z125 Encounter for screening for malignant neoplasm of prostate: Secondary | ICD-10-CM | POA: Diagnosis not present

## 2019-11-16 DIAGNOSIS — E559 Vitamin D deficiency, unspecified: Secondary | ICD-10-CM | POA: Diagnosis not present

## 2019-11-16 DIAGNOSIS — Z131 Encounter for screening for diabetes mellitus: Secondary | ICD-10-CM | POA: Diagnosis not present

## 2019-11-16 DIAGNOSIS — Z0000 Encounter for general adult medical examination without abnormal findings: Secondary | ICD-10-CM | POA: Diagnosis not present

## 2020-09-19 DIAGNOSIS — G4730 Sleep apnea, unspecified: Secondary | ICD-10-CM | POA: Insufficient documentation

## 2020-12-30 ENCOUNTER — Other Ambulatory Visit: Payer: Self-pay

## 2020-12-30 ENCOUNTER — Encounter: Payer: Self-pay | Admitting: Family Medicine

## 2020-12-30 ENCOUNTER — Ambulatory Visit (INDEPENDENT_AMBULATORY_CARE_PROVIDER_SITE_OTHER): Payer: 59 | Admitting: Family Medicine

## 2020-12-30 VITALS — BP 130/78 | HR 100 | Temp 98.0°F | Ht 68.0 in | Wt 192.0 lb

## 2020-12-30 DIAGNOSIS — F5101 Primary insomnia: Secondary | ICD-10-CM

## 2020-12-30 DIAGNOSIS — J3089 Other allergic rhinitis: Secondary | ICD-10-CM

## 2020-12-30 DIAGNOSIS — G4733 Obstructive sleep apnea (adult) (pediatric): Secondary | ICD-10-CM | POA: Insufficient documentation

## 2020-12-30 DIAGNOSIS — E782 Mixed hyperlipidemia: Secondary | ICD-10-CM | POA: Diagnosis not present

## 2020-12-30 DIAGNOSIS — N400 Enlarged prostate without lower urinary tract symptoms: Secondary | ICD-10-CM

## 2020-12-30 DIAGNOSIS — N5201 Erectile dysfunction due to arterial insufficiency: Secondary | ICD-10-CM | POA: Diagnosis not present

## 2020-12-30 MED ORDER — SILDENAFIL CITRATE 20 MG PO TABS: ORAL_TABLET | ORAL | 5 refills | Status: DC

## 2020-12-30 MED ORDER — FENOFIBRATE 160 MG PO TABS: 160.0000 mg | ORAL_TABLET | Freq: Every day | ORAL | 3 refills | Status: DC

## 2020-12-30 MED ORDER — TRAZODONE HCL 150 MG PO TABS: ORAL_TABLET | ORAL | 5 refills | Status: DC

## 2020-12-30 MED ORDER — FEXOFENADINE-PSEUDOEPHED ER 180-240 MG PO TB24: 1.0000 | ORAL_TABLET | Freq: Every evening | ORAL | 11 refills | Status: DC

## 2020-12-30 MED ORDER — FLUTICASONE PROPIONATE 50 MCG/ACT NA SUSP: 2.0000 | Freq: Two times a day (BID) | NASAL | 6 refills | Status: DC

## 2020-12-30 NOTE — Progress Notes (Signed)
Subjective:  Patient ID: Christopher Rosales, male    DOB: Jul 31, 1965  Age: 55 y.o. MRN: [id number]  CC: Establish Care   HPI Christopher Rosales presents for concern for sleep. Has sleep apnea. Can't get used to the machine. Working with Christopher Rosales. Has tried 3 different masks. Not getting enough air. Off of it for 2 weeks. Has chronic problem snoring, frequent awakening. This is been going on long-term.  It started long before he ever developed the sleep apnea.  However, the apnea has exacerbated the problem.Currently out of work and broke up with his girlfriend. Tired all the time. No energy.   Depression screen [facility] 2/9 12/30/2020 12/30/2020 09/22/2017  Decreased Interest 0 0 0  Down, Depressed, Hopeless 1 0 0  PHQ - 2 Score 1 0 0  Altered sleeping 3 - -  Tired, decreased energy 3 - -  Change in appetite 0 - -  Feeling bad or failure about yourself  0 - -  Trouble concentrating 0 - -  Moving slowly or fidgety/restless 0 - -  Suicidal thoughts 0 - -  PHQ-9 Score 7 - -  Difficult doing work/chores Not difficult at all - -    History Christopher Rosales has a past medical history of BPH (benign prostatic hyperplasia), DDD (degenerative disc disease), cervical, Hyperlipidemia, and Sleep apnea.   He has a past surgical history that includes Hernia repair (1995); Appendectomy (1981); and LASIK.   His family history is not on file. He was adopted.He reports that he has never smoked. He has never used smokeless tobacco. He reports current alcohol use of about 28.0 standard drinks per week. He reports that he does not use drugs.    ROS Review of Systems  Constitutional: Negative.   HENT: Negative.    Eyes:  Negative for visual disturbance.  Respiratory:  Negative for cough and shortness of breath.   Cardiovascular:  Negative for chest pain and leg swelling.  Gastrointestinal:  Negative for abdominal pain, diarrhea, nausea and vomiting.  Genitourinary:  Positive for frequency (with nocturia that exacerbates his  sleep disturbance). Negative for difficulty urinating.  Musculoskeletal:  Negative for arthralgias and myalgias.  Skin:  Negative for rash.  Neurological:  Negative for headaches.  Psychiatric/Behavioral:  Positive for sleep disturbance.    Objective:  BP 130/78   Pulse 100   Temp 98 F (36.7 C)   Ht 5\' 8"  (1.727 m)   Wt 192 lb (87.1 kg)   SpO2 99%   BMI 29.19 kg/m   BP Readings from Last 3 Encounters:  12/30/20 130/78  09/22/17 130/78  04/14/16 118/80    Wt Readings from Last 3 Encounters:  12/30/20 192 lb (87.1 kg)  09/22/17 191 lb (86.6 kg)  04/14/16 186 lb (84.4 kg)     Physical Exam Constitutional:      General: He is not in acute distress.    Appearance: He is well-developed.  HENT:     Head: Normocephalic and atraumatic.     Right Ear: External ear normal.     Left Ear: External ear normal.     Nose: Nose normal.  Eyes:     Conjunctiva/sclera: Conjunctivae normal.     Pupils: Pupils are equal, round, and reactive to light.  Cardiovascular:     Rate and Rhythm: Normal rate and regular rhythm.     Heart sounds: Normal heart sounds. No murmur heard. Pulmonary:     Effort: Pulmonary effort is normal. No respiratory distress.  Breath sounds: Normal breath sounds. No wheezing or rales.  Abdominal:     Palpations: Abdomen is soft.     Tenderness: There is no abdominal tenderness.  Musculoskeletal:        General: Normal range of motion.     Cervical back: Normal range of motion and neck supple.  Skin:    General: Skin is warm and dry.  Neurological:     Mental Status: He is alert and oriented to person, place, and time.     Deep Tendon Reflexes: Reflexes are normal and symmetric.  Psychiatric:        Behavior: Behavior normal.        Thought Content: Thought content normal.        Judgment: Judgment normal.      Assessment & Plan:   Christopher Rosales was seen today for establish care.  Diagnoses and all orders for this visit:  Mixed hyperlipidemia -      fenofibrate 160 MG tablet; Take 1 tablet (160 mg total) by mouth daily. For cholesterol and triglyceride  OSA (obstructive sleep apnea) -     fexofenadine-pseudoephedrine (Rosales-D 24) 180-240 MG 24 hr tablet; Take 1 tablet by mouth every evening. For allergy and congestion -     fluticasone (FLONASE) 50 MCG/ACT nasal spray; Place 2 sprays into both nostrils 2 (two) times daily.  Non-seasonal allergic rhinitis, unspecified trigger -     fexofenadine-pseudoephedrine (Rosales-D 24) 180-240 MG 24 hr tablet; Take 1 tablet by mouth every evening. For allergy and congestion -     fluticasone (FLONASE) 50 MCG/ACT nasal spray; Place 2 sprays into both nostrils 2 (two) times daily.  Erectile dysfunction due to arterial insufficiency -     sildenafil (REVATIO) 20 MG tablet; Take 2-5 pills at once, orally, with each sexual encounter  Benign prostatic hyperplasia, unspecified whether lower urinary tract symptoms present  Primary insomnia -     traZODone (DESYREL) 150 MG tablet; Use from 1/3 to 1 tablet nightly as needed for sleep.    For BPH pt. Will try to drink his daily water need ,but finish at least two hours before bedtime.  I have discontinued Christopher Hua B. Rosales "Christopher Rosales"'s diclofenac, Vardenafil HCl, and atorvastatin. I am also having him start on fenofibrate, fexofenadine-pseudoephedrine, fluticasone, traZODone, and sildenafil. Additionally, I am having him maintain his Vitamin D (Ergocalciferol).  Allergies as of 12/30/2020       Reactions   Ivp Dye [iodinated Diagnostic Agents] Hives   Other reaction(s): Unknown   Statins Other (See Comments)   myalgias        Medication List        Accurate as of December 30, 2020  2:31 PM. If you have any questions, ask your nurse or doctor.          STOP taking these medications    atorvastatin 10 MG tablet Commonly known as: LIPITOR Stopped by: Christopher Claude, MD   diclofenac 75 MG EC tablet Commonly known as: VOLTAREN Stopped by:  Christopher Claude, MD   Vardenafil HCl 10 MG Tbdp Commonly known as: Staxyn Stopped by: Christopher Claude, MD       TAKE these medications    fenofibrate 160 MG tablet Take 1 tablet (160 mg total) by mouth daily. For cholesterol and triglyceride Started by: Christopher Claude, MD   fexofenadine-pseudoephedrine 180-240 MG 24 hr tablet Commonly known as: Rosales-D 24 Take 1 tablet by mouth every evening. For allergy and congestion Started by: Christopher Claude, MD   fluticasone  50 MCG/ACT nasal spray Commonly known as: FLONASE Place 2 sprays into both nostrils 2 (two) times daily. Started by: Claretta Fraise, MD   sildenafil 20 MG tablet Commonly known as: REVATIO Take 2-5 pills at once, orally, with each sexual encounter Started by: Claretta Fraise, MD   traZODone 150 MG tablet Commonly known as: DESYREL Use from 1/3 to 1 tablet nightly as needed for sleep. Started by: Claretta Fraise, MD   Vitamin D (Ergocalciferol) 1.25 MG (50000 UNIT) Caps capsule Commonly known as: DRISDOL Take 1 capsule (50,000 Units total) by mouth every 7 (seven) days.         Follow-up: Return in about 6 weeks (around 02/10/2021) for cholesterol.  Claretta Fraise, M.D.

## 2021-01-25 ENCOUNTER — Other Ambulatory Visit: Payer: Self-pay | Admitting: Family Medicine

## 2021-01-25 DIAGNOSIS — F5101 Primary insomnia: Secondary | ICD-10-CM

## 2021-01-30 DIAGNOSIS — G4733 Obstructive sleep apnea (adult) (pediatric): Secondary | ICD-10-CM | POA: Diagnosis not present

## 2021-02-10 ENCOUNTER — Encounter: Payer: Self-pay | Admitting: Family Medicine

## 2021-02-10 ENCOUNTER — Ambulatory Visit (INDEPENDENT_AMBULATORY_CARE_PROVIDER_SITE_OTHER): Payer: BC Managed Care – PPO | Admitting: Family Medicine

## 2021-02-10 VITALS — BP 114/67 | HR 79 | Temp 98.0°F | Ht 68.0 in | Wt 192.8 lb

## 2021-02-10 DIAGNOSIS — E782 Mixed hyperlipidemia: Secondary | ICD-10-CM | POA: Diagnosis not present

## 2021-02-10 DIAGNOSIS — F5101 Primary insomnia: Secondary | ICD-10-CM | POA: Diagnosis not present

## 2021-02-10 DIAGNOSIS — Z789 Other specified health status: Secondary | ICD-10-CM

## 2021-02-10 LAB — LIPID PANEL
Chol/HDL Ratio: 3.8 ratio (ref 0.0–5.0)
Cholesterol, Total: 189 mg/dL (ref 100–199)
HDL: 50 mg/dL (ref >39)
LDL Chol Calc (NIH): 105 mg/dL — ABNORMAL HIGH (ref 0–99)
Triglycerides: 197 mg/dL — ABNORMAL HIGH (ref 0–149)
VLDL Cholesterol Cal: 34 mg/dL (ref 5–40)

## 2021-02-10 LAB — CMP14+EGFR
ALT: 19 IU/L (ref 0–44)
AST: 16 IU/L (ref 0–40)
Albumin/Globulin Ratio: 1.9 (ref 1.2–2.2)
Albumin: 4.2 g/dL (ref 3.8–4.9)
Alkaline Phosphatase: 47 IU/L (ref 44–121)
BUN/Creatinine Ratio: 11 (ref 9–20)
BUN: 13 mg/dL (ref 6–24)
Bilirubin Total: 0.5 mg/dL (ref 0.0–1.2)
CO2: 24 mmol/L (ref 20–29)
Calcium: 9.5 mg/dL (ref 8.7–10.2)
Chloride: 105 mmol/L (ref 96–106)
Creatinine, Ser: 1.21 mg/dL (ref 0.76–1.27)
Globulin, Total: 2.2 g/dL (ref 1.5–4.5)
Glucose: 94 mg/dL (ref 70–99)
Potassium: 4.3 mmol/L (ref 3.5–5.2)
Sodium: 140 mmol/L (ref 134–144)
Total Protein: 6.4 g/dL (ref 6.0–8.5)
eGFR: 70 mL/min/{1.73_m2} (ref >59)

## 2021-02-10 NOTE — Progress Notes (Signed)
Subjective:  Patient ID: Christopher Rosales, male    DOB: 12/11/1965  Age: 56 y.o. MRN: [id number]  CC: Medical Management of Chronic Issues   HPI DVAUGHN FICKLE presents for follow-up of elevated cholesterol. Doing well without complaints on current medication. Denies side effects of fibrate including myalgia and arthralgia and nausea. Also in today for liver function testing. Currently no chest pain, shortness of breath or other cardiovascular related symptoms noted.  Hasn't used trazodone. Sleep better since he found a mask he can tolerate. Getting better sleep.Still awakening, but back asleep rapidly.   Hasn't had opportunity to try sildenafil.   History Jehan has a past medical history of BPH (benign prostatic hyperplasia), DDD (degenerative disc disease), cervical, Hyperlipidemia, and Sleep apnea.   He has a past surgical history that includes Hernia repair (1995); Appendectomy (1981); and LASIK.   His family history is not on file. He was adopted.He reports that he has never smoked. He has never used smokeless tobacco. He reports current alcohol use of about 28.0 standard drinks per week. He reports that he does not use drugs.  Current Outpatient Medications on File Prior to Visit  Medication Sig Dispense Refill   fenofibrate 160 MG tablet Take 1 tablet (160 mg total) by mouth daily. For cholesterol and triglyceride 90 tablet 3   fexofenadine-pseudoephedrine (ALLEGRA-D 24) 180-240 MG 24 hr tablet Take 1 tablet by mouth every evening. For allergy and congestion 30 tablet 11   fluticasone (FLONASE) 50 MCG/ACT nasal spray Place 2 sprays into both nostrils 2 (two) times daily. 16 g 6   sildenafil (REVATIO) 20 MG tablet Take 2-5 pills at once, orally, with each sexual encounter 50 tablet 5   traZODone (DESYREL) 150 MG tablet USE FROM 1/3 TO 1 TABLET NIGHTLY AS NEEDED FOR SLEEP. 90 tablet 1   Vitamin D, Ergocalciferol, (DRISDOL) 50000 units CAPS capsule Take 1 capsule (50,000 Units total) by  mouth every 7 (seven) days. 12 capsule 3   No current facility-administered medications on file prior to visit.    ROS Review of Systems  Constitutional:  Negative for fever.  Respiratory:  Negative for shortness of breath.   Cardiovascular:  Negative for chest pain.  Musculoskeletal:  Negative for arthralgias.  Skin:  Negative for rash.  Psychiatric/Behavioral:  Positive for self-injury.    Objective:  BP 114/67    Pulse 79    Temp 98 F (36.7 C)    Ht 5' 8"  (1.727 m)    Wt 192 lb 12.8 oz (87.5 kg)    SpO2 98%    BMI 29.32 kg/m   BP Readings from Last 3 Encounters:  02/10/21 114/67  12/30/20 130/78  09/22/17 130/78    Wt Readings from Last 3 Encounters:  02/10/21 192 lb 12.8 oz (87.5 kg)  12/30/20 192 lb (87.1 kg)  09/22/17 191 lb (86.6 kg)     Physical Exam Vitals reviewed.  Constitutional:      Appearance: He is well-developed.  HENT:     Head: Normocephalic and atraumatic.     Right Ear: External ear normal.     Left Ear: External ear normal.     Mouth/Throat:     Pharynx: No oropharyngeal exudate or posterior oropharyngeal erythema.  Eyes:     Pupils: Pupils are equal, round, and reactive to light.  Cardiovascular:     Rate and Rhythm: Normal rate and regular rhythm.     Heart sounds: No murmur heard. Pulmonary:     Effort:  No respiratory distress.     Breath sounds: Normal breath sounds.  Abdominal:     Palpations: There is no mass.     Tenderness: There is no abdominal tenderness. There is no guarding.  Musculoskeletal:     Cervical back: Normal range of motion and neck supple.  Neurological:     Mental Status: He is alert and oriented to person, place, and time.    No results found for: HGBA1C  Lab Results  Component Value Date   WBC 6.4 09/22/2017   HGB 14.8 09/22/2017   HCT 43.7 09/22/2017   PLT 290 09/22/2017   GLUCOSE 94 09/22/2017   CHOL 218 (H) 09/22/2017   TRIG 284 (H) 09/22/2017   HDL 47 09/22/2017   LDLCALC 114 (H) 09/22/2017    ALT 23 09/22/2017   AST 25 09/22/2017   NA 139 09/22/2017   K 4.5 09/22/2017   CL 99 09/22/2017   CREATININE 1.07 09/22/2017   BUN 14 09/22/2017   CO2 24 09/22/2017   TSH 1.540 01/02/2015   PSA 0.5 11/08/2013    No results found.  Assessment & Plan:   Manases was seen today for medical management of chronic issues.  Diagnoses and all orders for this visit:  Mixed hyperlipidemia -     Lipid panel -     CMP14+EGFR  Primary insomnia  Statin intolerance   I am having [name] [name] "[name]" maintain his Vitamin D (Ergocalciferol), fenofibrate, fexofenadine-pseudoephedrine, fluticasone, sildenafil, and traZODone.  No orders of the defined types were placed in this encounter.    Follow-up: Return in about 6 months (around 08/10/2021).  Claretta Fraise, M.D.

## 2021-02-11 ENCOUNTER — Other Ambulatory Visit: Payer: Self-pay | Admitting: Family Medicine

## 2021-02-11 ENCOUNTER — Encounter: Payer: Self-pay | Admitting: Family Medicine

## 2021-02-11 MED ORDER — CIPROFLOXACIN HCL 500 MG PO TABS: 500.0000 mg | ORAL_TABLET | Freq: Two times a day (BID) | ORAL | 0 refills | Status: DC

## 2021-02-11 NOTE — Progress Notes (Signed)
Hello Christopher Rosales,  Your lab result is normal and/or stable.Some minor variations that are not significant are commonly marked abnormal, but do not represent any medical problem for you.  Best regards, Pleasant Bensinger, M.D.

## 2021-02-18 ENCOUNTER — Other Ambulatory Visit: Payer: Self-pay | Admitting: Family Medicine

## 2021-02-18 MED ORDER — DOXYCYCLINE HYCLATE 100 MG PO CAPS: 100.0000 mg | ORAL_CAPSULE | Freq: Two times a day (BID) | ORAL | 0 refills | Status: DC

## 2021-03-02 DIAGNOSIS — G4733 Obstructive sleep apnea (adult) (pediatric): Secondary | ICD-10-CM | POA: Diagnosis not present

## 2021-03-30 DIAGNOSIS — G4733 Obstructive sleep apnea (adult) (pediatric): Secondary | ICD-10-CM | POA: Diagnosis not present

## 2021-04-30 DIAGNOSIS — G4733 Obstructive sleep apnea (adult) (pediatric): Secondary | ICD-10-CM | POA: Diagnosis not present

## 2021-05-30 DIAGNOSIS — G4733 Obstructive sleep apnea (adult) (pediatric): Secondary | ICD-10-CM | POA: Diagnosis not present

## 2021-06-30 DIAGNOSIS — G4733 Obstructive sleep apnea (adult) (pediatric): Secondary | ICD-10-CM | POA: Diagnosis not present

## 2021-08-11 ENCOUNTER — Ambulatory Visit: Payer: BC Managed Care – PPO | Admitting: Family Medicine

## 2021-08-11 ENCOUNTER — Encounter: Payer: Self-pay | Admitting: Family Medicine

## 2021-08-11 ENCOUNTER — Other Ambulatory Visit: Payer: Self-pay | Admitting: Family Medicine

## 2021-08-11 VITALS — BP 112/74 | HR 72 | Temp 98.0°F | Ht 68.0 in | Wt 193.4 lb

## 2021-08-11 DIAGNOSIS — N5201 Erectile dysfunction due to arterial insufficiency: Secondary | ICD-10-CM | POA: Diagnosis not present

## 2021-08-11 DIAGNOSIS — Z789 Other specified health status: Secondary | ICD-10-CM | POA: Diagnosis not present

## 2021-08-11 DIAGNOSIS — M7711 Lateral epicondylitis, right elbow: Secondary | ICD-10-CM

## 2021-08-11 DIAGNOSIS — E559 Vitamin D deficiency, unspecified: Secondary | ICD-10-CM | POA: Diagnosis not present

## 2021-08-11 DIAGNOSIS — E782 Mixed hyperlipidemia: Secondary | ICD-10-CM

## 2021-08-11 MED ORDER — VARDENAFIL HCL 10 MG PO TBDP: 10.0000 mg | ORAL_TABLET | Freq: Every day | ORAL | 11 refills | Status: DC | PRN

## 2021-08-11 MED ORDER — DICLOFENAC SODIUM 75 MG PO TBEC: 75.0000 mg | DELAYED_RELEASE_TABLET | Freq: Two times a day (BID) | ORAL | 2 refills | Status: DC

## 2021-08-11 NOTE — Progress Notes (Signed)
Subjective:  Patient ID: Christopher Rosales, male    DOB: 1965-02-25  Age: 56 y.o. MRN: [id number]  CC: Medical Management of Chronic Issues   HPI Christopher Rosales presents for follow-up of elevated cholesterol. Doing well without complaints on current medication. Denies side effects of fenofibrate including myalgia and arthralgia and nausea. Also in today for liver function testing. Currently no chest pain, shortness of breath or other cardiovascular related symptoms noted. History Christopher Rosales has a past medical history of BPH (benign prostatic hyperplasia), DDD (degenerative disc disease), cervical, Hyperlipidemia, and Sleep apnea.   Christopher Rosales has a past surgical history that includes Hernia repair (1995); Appendectomy (1981); and LASIK.   His family history is not on file. Christopher Rosales was adopted.Christopher Rosales reports that Christopher Rosales has never smoked. Christopher Rosales has never used smokeless tobacco. Christopher Rosales reports current alcohol use of about 28.0 standard drinks of alcohol per week. Christopher Rosales reports that Christopher Rosales does not use drugs.  Current Outpatient Medications on File Prior to Visit  Medication Sig Dispense Refill   Cholecalciferol (VITAMIN D3) 75 MCG (3000 UT) TABS Take by mouth daily.     fenofibrate 160 MG tablet Take 1 tablet (160 mg total) by mouth daily. For cholesterol and triglyceride 90 tablet 3   fluticasone (FLONASE) 50 MCG/ACT nasal spray Place 2 sprays into both nostrils 2 (two) times daily. 16 g 6   No current facility-administered medications on file prior to visit.    ROS Review of Systems  Constitutional:  Negative for fever.  Respiratory:  Negative for shortness of breath.   Cardiovascular:  Negative for chest pain.  Genitourinary:        ED responds better to vardinafil than to sildenafil. However it does cause some congestion  Musculoskeletal:  Positive for arthralgias (right elbow PAIN WITH RESISTED FLEXION. rADIATES TO THE WRIST).  Skin:  Negative for rash.    Objective:  BP 112/74   Pulse 72   Temp 98 F (36.7 C)   Ht 5'  8" (1.727 m)   Wt 193 lb 6.4 oz (87.7 kg)   SpO2 98%   BMI 29.41 kg/m   BP Readings from Last 3 Encounters:  08/11/21 112/74  02/10/21 114/67  12/30/20 130/78    Wt Readings from Last 3 Encounters:  08/11/21 193 lb 6.4 oz (87.7 kg)  02/10/21 192 lb 12.8 oz (87.5 kg)  12/30/20 192 lb (87.1 kg)     Physical Exam Vitals reviewed.  Constitutional:      Appearance: Christopher Rosales is well-developed.  HENT:     Head: Normocephalic and atraumatic.     Right Ear: External ear normal.     Left Ear: External ear normal.     Mouth/Throat:     Pharynx: No oropharyngeal exudate or posterior oropharyngeal erythema.  Eyes:     Pupils: Pupils are equal, round, and reactive to light.  Cardiovascular:     Rate and Rhythm: Normal rate and regular rhythm.     Heart sounds: No murmur heard. Pulmonary:     Effort: No respiratory distress.     Breath sounds: Normal breath sounds.  Musculoskeletal:        General: Tenderness (tender at right elbow for extensor tendon) present.     Cervical back: Normal range of motion and neck supple.  Neurological:     Mental Status: Christopher Rosales is alert and oriented to person, place, and time.     No results found for: "HGBA1C"  Lab Results  Component Value Date   WBC  6.4 09/22/2017   HGB 14.8 09/22/2017   HCT 43.7 09/22/2017   PLT 290 09/22/2017   GLUCOSE 94 02/10/2021   CHOL 189 02/10/2021   TRIG 197 (H) 02/10/2021   HDL 50 02/10/2021   LDLCALC 105 (H) 02/10/2021   ALT 19 02/10/2021   AST 16 02/10/2021   NA 140 02/10/2021   K 4.3 02/10/2021   CL 105 02/10/2021   CREATININE 1.21 02/10/2021   BUN 13 02/10/2021   CO2 24 02/10/2021   TSH 1.540 01/02/2015   PSA 0.5 11/08/2013    No results found.  Assessment & Plan:   Christopher Rosales was seen today for medical management of chronic issues.  Diagnoses and all orders for this visit:  Mixed hyperlipidemia -     CBC with Differential/Platelet -     CMP14+EGFR -     Lipid panel  Erectile dysfunction due to  arterial insufficiency  Statin intolerance  Vitamin D deficiency -     VITAMIN D 25 Hydroxy (Vit-D Deficiency, Fractures)  Lateral epicondylitis of right elbow  Other orders -     Vardenafil HCl 10 MG TBDP; Take 10 mg by mouth daily as needed. -     diclofenac (VOLTAREN) 75 MG EC tablet; Take 1 tablet (75 mg total) by mouth 2 (two) times daily. For muscle and  Joint pain   I have discontinued Christopher Rosales "[name]"'s Vitamin D (Ergocalciferol), fexofenadine-pseudoephedrine, sildenafil, traZODone, and doxycycline. I am also having him start on Vardenafil HCl and diclofenac. Additionally, I am having him maintain his fenofibrate, fluticasone, and Vitamin D3.  Meds ordered this encounter  Medications   Vardenafil HCl 10 MG TBDP    Sig: Take 10 mg by mouth daily as needed.    Dispense:  8 tablet    Refill:  11   diclofenac (VOLTAREN) 75 MG EC tablet    Sig: Take 1 tablet (75 mg total) by mouth 2 (two) times daily. For muscle and  Joint pain    Dispense:  60 tablet    Refill:  2     Follow-up: No follow-ups on file.  Claretta Fraise, M.D.

## 2021-08-12 LAB — CBC WITH DIFFERENTIAL/PLATELET
Basophils Absolute: 0.1 10*3/uL (ref 0.0–0.2)
Basos: 1 %
EOS (ABSOLUTE): 0.2 10*3/uL (ref 0.0–0.4)
Eos: 3 %
Hematocrit: 42.5 % (ref 37.5–51.0)
Hemoglobin: 14.5 g/dL (ref 13.0–17.7)
Immature Grans (Abs): 0 10*3/uL (ref 0.0–0.1)
Immature Granulocytes: 0 %
Lymphocytes Absolute: 1.5 10*3/uL (ref 0.7–3.1)
Lymphs: 26 %
MCH: 31.3 pg (ref 26.6–33.0)
MCHC: 34.1 g/dL (ref 31.5–35.7)
MCV: 92 fL (ref 79–97)
Monocytes Absolute: 0.8 10*3/uL (ref 0.1–0.9)
Monocytes: 14 %
Neutrophils Absolute: 3.3 10*3/uL (ref 1.4–7.0)
Neutrophils: 56 %
Platelets: 276 10*3/uL (ref 150–450)
RBC: 4.64 x10E6/uL (ref 4.14–5.80)
RDW: 12.3 % (ref 11.6–15.4)
WBC: 5.8 10*3/uL (ref 3.4–10.8)

## 2021-08-12 LAB — LIPID PANEL
Chol/HDL Ratio: 3.5 ratio (ref 0.0–5.0)
Cholesterol, Total: 163 mg/dL (ref 100–199)
HDL: 46 mg/dL (ref >39)
LDL Chol Calc (NIH): 90 mg/dL (ref 0–99)
Triglycerides: 158 mg/dL — ABNORMAL HIGH (ref 0–149)
VLDL Cholesterol Cal: 27 mg/dL (ref 5–40)

## 2021-08-12 LAB — CMP14+EGFR
ALT: 20 IU/L (ref 0–44)
AST: 17 IU/L (ref 0–40)
Albumin/Globulin Ratio: 1.9 (ref 1.2–2.2)
Albumin: 4.3 g/dL (ref 3.8–4.9)
Alkaline Phosphatase: 55 IU/L (ref 44–121)
BUN/Creatinine Ratio: 13 (ref 9–20)
BUN: 14 mg/dL (ref 6–24)
Bilirubin Total: 0.4 mg/dL (ref 0.0–1.2)
CO2: 23 mmol/L (ref 20–29)
Calcium: 9.8 mg/dL (ref 8.7–10.2)
Chloride: 103 mmol/L (ref 96–106)
Creatinine, Ser: 1.11 mg/dL (ref 0.76–1.27)
Globulin, Total: 2.3 g/dL (ref 1.5–4.5)
Glucose: 96 mg/dL (ref 70–99)
Potassium: 4.4 mmol/L (ref 3.5–5.2)
Sodium: 140 mmol/L (ref 134–144)
Total Protein: 6.6 g/dL (ref 6.0–8.5)
eGFR: 77 mL/min/{1.73_m2} (ref >59)

## 2021-08-12 LAB — VITAMIN D 25 HYDROXY (VIT D DEFICIENCY, FRACTURES): Vit D, 25-Hydroxy: 52.7 ng/mL (ref 30.0–100.0)

## 2021-08-12 NOTE — Progress Notes (Signed)
Hello Tri,  Your lab result is normal and/or stable.Some minor variations that are not significant are commonly marked abnormal, but do not represent any medical problem for you.  Best regards, Adalay Azucena, M.D.

## 2021-10-04 ENCOUNTER — Telehealth: Payer: Self-pay | Admitting: Physician Assistant

## 2021-10-04 DIAGNOSIS — N390 Urinary tract infection, site not specified: Secondary | ICD-10-CM

## 2021-10-04 NOTE — Progress Notes (Signed)
E-Visit for Urinary Problems ? ?Based on what you shared with me, I feel your condition warrants further evaluation and I recommend that you be seen for a face to face office visit.  Male bladder infections are not very common.  We worry about prostate or kidney conditions.  The standard of care is to examine the abdomen and kidneys, and to do a urine and blood test to make sure that something more serious is not going on.  We recommend that you see a provider today.  If your doctor's office is closed [facility] has the following Urgent Cares: ? ?  ?NOTE: You will not be charged for this e-visit. ? ?If you are having a true medical emergency please call 911.   ? ?  ? For an urgent face to face visit, [facility] has six urgent care centers for your convenience:  ?  ? [facility] [place] ?Get Driving Directions ?[phone] ?[address] ?[place], [place] [address] ?  ? [facility] ([place]) ?Get Driving Directions ?[phone] ?[address] ?[place], [place] [address] ? ?[facility] ([place] [facility] [place]) ?Get Driving Directions ?[phone] ?[address] ?[place],  [place]  [address] ? ?[facility] ?Get Driving Directions ?[phone] ?[address] ?[place], [place] [address] ?  ?[facility] [place] ?Get Driving Directions  ?[phone] ?[address] ?[address] ?[place], [place] [address] ?  ?[facility] [place] ?Get Driving Directions ?[phone] ?[address] ?[place], [place] [address] ? ?Your MyChart E-visit questionnaire answers were reviewed by a board certified advanced clinical practitioner to complete your personal care plan based on your specific symptoms.  Thank you for using e-Visits. ?  ? ?I provided 5 minutes of non face-to-face time during this encounter for chart review and documentation.  ? ?

## 2021-10-07 ENCOUNTER — Encounter: Payer: Self-pay | Admitting: Family Medicine

## 2021-10-07 ENCOUNTER — Ambulatory Visit: Payer: BC Managed Care – PPO | Admitting: Family Medicine

## 2021-10-07 VITALS — BP 130/79 | HR 84 | Temp 97.8°F | Ht 68.0 in | Wt 189.6 lb

## 2021-10-07 DIAGNOSIS — N410 Acute prostatitis: Secondary | ICD-10-CM

## 2021-10-07 DIAGNOSIS — R300 Dysuria: Secondary | ICD-10-CM

## 2021-10-07 LAB — URINALYSIS, COMPLETE
Bilirubin, UA: NEGATIVE
Glucose, UA: NEGATIVE
Ketones, UA: NEGATIVE
Leukocytes,UA: NEGATIVE
Nitrite, UA: NEGATIVE
Protein,UA: NEGATIVE
RBC, UA: NEGATIVE
Specific Gravity, UA: 1.01 (ref 1.005–1.030)
Urobilinogen, Ur: 0.2 mg/dL (ref 0.2–1.0)
pH, UA: 6.5 (ref 5.0–7.5)

## 2021-10-07 LAB — MICROSCOPIC EXAMINATION
Bacteria, UA: NONE SEEN
Epithelial Cells (non renal): NONE SEEN /HPF (ref 0–10)
RBC, Urine: NONE SEEN /HPF (ref 0–2)
Renal Epithel, UA: NONE SEEN /HPF
WBC, UA: NONE SEEN /HPF (ref 0–5)

## 2021-10-07 MED ORDER — DOXYCYCLINE MONOHYDRATE 50 MG PO CAPS: 100.0000 mg | ORAL_CAPSULE | Freq: Two times a day (BID) | ORAL | 1 refills | Status: DC

## 2021-10-07 NOTE — Progress Notes (Signed)
Subjective:  Patient ID: Christopher Rosales, male    DOB: [birth year]  Age: 56 y.o. MRN: [id number]  CC: Dysuria   HPI [name] presents for achiness in the groin. Feels like he is sitting on a spongy golf ball. Onset 3 weeks. Dysuria during and just after urination. Has had recurrent prostatitis for many years. No fever, chills. No frequency. Some dribbling. Cipro used last time upset stomach.      10/07/2021    1:19 PM 08/11/2021    8:12 AM 08/11/2021    8:03 AM  Depression screen PHQ 2/9  Decreased Interest 0 0 0  Down, Depressed, Hopeless 0 0 0  PHQ - 2 Score 0 0 0  Altered sleeping  3   Tired, decreased energy  0   Change in appetite  0   Feeling bad or failure about yourself   0   Trouble concentrating  0   Moving slowly or fidgety/restless  0   Suicidal thoughts  0   PHQ-9 Score  3   Difficult doing work/chores  Not difficult at all     History Olivier has a past medical history of BPH (benign prostatic hyperplasia), DDD (degenerative disc disease), cervical, Hyperlipidemia, and Sleep apnea.   He has a past surgical history that includes Hernia repair (1995); Appendectomy (1981); and LASIK.   His family history is not on file. He was adopted.He reports that he has never smoked. He has never used smokeless tobacco. He reports current alcohol use of about 28.0 standard drinks of alcohol per week. He reports that he does not use drugs.    ROS Review of Systems  Objective:  BP 130/79   Pulse 84   Temp 97.8 F (36.6 C)   Ht 5\' 8"  (1.727 m)   Wt 189 lb 9.6 oz (86 kg)   SpO2 97%   BMI 28.83 kg/m   BP Readings from Last 3 Encounters:  10/07/21 130/79  08/11/21 112/74  02/10/21 114/67    Wt Readings from Last 3 Encounters:  10/07/21 189 lb 9.6 oz (86 kg)  08/11/21 193 lb 6.4 oz (87.7 kg)  02/10/21 192 lb 12.8 oz (87.5 kg)     Physical Exam Vitals reviewed.  Constitutional:      Appearance: He is well-developed.  HENT:     Head: Normocephalic and  atraumatic.     Right Ear: External ear normal.     Left Ear: External ear normal.     Mouth/Throat:     Pharynx: No oropharyngeal exudate or posterior oropharyngeal erythema.  Eyes:     Pupils: Pupils are equal, round, and reactive to light.  Cardiovascular:     Rate and Rhythm: Normal rate and regular rhythm.     Heart sounds: No murmur heard. Pulmonary:     Effort: No respiratory distress.     Breath sounds: Normal breath sounds.  Musculoskeletal:     Cervical back: Normal range of motion and neck supple.  Neurological:     Mental Status: He is alert and oriented to person, place, and time.       Assessment & Plan:   Maclin was seen today for dysuria.  Diagnoses and all orders for this visit:  Dysuria  Acute prostatitis -     Urinalysis, Complete -     doxycycline (MONODOX) 50 MG capsule; Take 2 capsules (100 mg total) by mouth 2 (two) times daily. -     Microscopic Examination  I am having [name] [name] "[name]" start on doxycycline. I am also having him maintain his fenofibrate, fluticasone, Vitamin D3, diclofenac, and Vardenafil HCl.  Allergies as of 10/07/2021       Reactions   Ivp Dye [iodinated Contrast Media] Hives   Other reaction(s): Unknown   Ciprofloxacin Other (See Comments)   Tingling in extremities, tinnitus, and mood disturbances   Statins Other (See Comments)   myalgias        Medication List        Accurate as of October 07, 2021  7:40 PM. If you have any questions, ask your nurse or doctor.          diclofenac 75 MG EC tablet Commonly known as: VOLTAREN Take 1 tablet (75 mg total) by mouth 2 (two) times daily. For muscle and  Joint pain   doxycycline 50 MG capsule Commonly known as: MONODOX Take 2 capsules (100 mg total) by mouth 2 (two) times daily. Started by: Claretta Fraise, MD   fenofibrate 160 MG tablet Take 1 tablet (160 mg total) by mouth daily. For cholesterol and triglyceride   fluticasone 50 MCG/ACT nasal  spray Commonly known as: FLONASE Place 2 sprays into both nostrils 2 (two) times daily.   Vardenafil HCl 10 MG Tbdp TAKE 1 TABLET BY MOUTH EVERY DAY AS NEEDED   Vitamin D3 75 MCG (3000 UT) Tabs Take by mouth daily.         Follow-up: Return if symptoms worsen or fail to improve.  Claretta Fraise, M.D.

## 2021-11-20 ENCOUNTER — Ambulatory Visit (INDEPENDENT_AMBULATORY_CARE_PROVIDER_SITE_OTHER): Payer: BC Managed Care – PPO | Admitting: Family Medicine

## 2021-11-20 ENCOUNTER — Encounter: Payer: Self-pay | Admitting: Family Medicine

## 2021-11-20 ENCOUNTER — Encounter: Payer: Self-pay | Admitting: *Deleted

## 2021-11-20 VITALS — BP 123/69 | HR 77 | Temp 99.0°F | Ht 68.0 in | Wt 193.0 lb

## 2021-11-20 DIAGNOSIS — J0100 Acute maxillary sinusitis, unspecified: Secondary | ICD-10-CM

## 2021-11-20 DIAGNOSIS — E782 Mixed hyperlipidemia: Secondary | ICD-10-CM | POA: Diagnosis not present

## 2021-11-20 DIAGNOSIS — G4733 Obstructive sleep apnea (adult) (pediatric): Secondary | ICD-10-CM

## 2021-11-20 DIAGNOSIS — Z23 Encounter for immunization: Secondary | ICD-10-CM | POA: Diagnosis not present

## 2021-11-20 DIAGNOSIS — K429 Umbilical hernia without obstruction or gangrene: Secondary | ICD-10-CM

## 2021-11-20 DIAGNOSIS — Z0000 Encounter for general adult medical examination without abnormal findings: Secondary | ICD-10-CM

## 2021-11-20 DIAGNOSIS — Z87438 Personal history of other diseases of male genital organs: Secondary | ICD-10-CM | POA: Insufficient documentation

## 2021-11-20 DIAGNOSIS — N5201 Erectile dysfunction due to arterial insufficiency: Secondary | ICD-10-CM

## 2021-11-20 DIAGNOSIS — R6882 Decreased libido: Secondary | ICD-10-CM

## 2021-11-20 DIAGNOSIS — S39012A Strain of muscle, fascia and tendon of lower back, initial encounter: Secondary | ICD-10-CM

## 2021-11-20 DIAGNOSIS — Z0001 Encounter for general adult medical examination with abnormal findings: Secondary | ICD-10-CM

## 2021-11-20 DIAGNOSIS — E559 Vitamin D deficiency, unspecified: Secondary | ICD-10-CM | POA: Diagnosis not present

## 2021-11-20 DIAGNOSIS — N481 Balanitis: Secondary | ICD-10-CM

## 2021-11-20 DIAGNOSIS — K649 Unspecified hemorrhoids: Secondary | ICD-10-CM | POA: Insufficient documentation

## 2021-11-20 DIAGNOSIS — N400 Enlarged prostate without lower urinary tract symptoms: Secondary | ICD-10-CM

## 2021-11-20 DIAGNOSIS — J3089 Other allergic rhinitis: Secondary | ICD-10-CM | POA: Diagnosis not present

## 2021-11-20 DIAGNOSIS — Z789 Other specified health status: Secondary | ICD-10-CM

## 2021-11-20 DIAGNOSIS — M545 Low back pain, unspecified: Secondary | ICD-10-CM | POA: Insufficient documentation

## 2021-11-20 MED ORDER — CLOTRIMAZOLE-BETAMETHASONE 1-0.05 % EX CREA: 1.0000 | TOPICAL_CREAM | Freq: Two times a day (BID) | CUTANEOUS | 1 refills | Status: AC

## 2021-11-20 MED ORDER — AMOXICILLIN-POT CLAVULANATE 875-125 MG PO TABS: 1.0000 | ORAL_TABLET | Freq: Two times a day (BID) | ORAL | 0 refills | Status: DC

## 2021-11-20 MED ORDER — TIZANIDINE HCL 4 MG PO TABS: 4.0000 mg | ORAL_TABLET | Freq: Four times a day (QID) | ORAL | 1 refills | Status: DC | PRN

## 2021-11-20 MED ORDER — FENOFIBRATE 160 MG PO TABS: 160.0000 mg | ORAL_TABLET | Freq: Every day | ORAL | 3 refills | Status: DC

## 2021-11-20 NOTE — Patient Instructions (Signed)

## 2021-11-20 NOTE — Progress Notes (Signed)
Subjective:  Patient ID: Christopher Rosales, male    DOB: Mar 16, 1965  Age: 56 y.o. MRN: [id number]  CC: Annual Exam   HPI [name] presents for Annual exam  Sex drive is down at times.  Alcohol was 4 drinks a day until 2 mos ago reduced to 3 drinks. Usually beer. Sometimes liquor. Says it is just a routine habit.      10/07/2021    1:19 PM 08/11/2021    8:12 AM 08/11/2021    8:03 AM  Depression screen PHQ 2/9  Decreased Interest 0 0 0  Down, Depressed, Hopeless 0 0 0  PHQ - 2 Score 0 0 0  Altered sleeping  3   Tired, decreased energy  0   Change in appetite  0   Feeling bad or failure about yourself   0   Trouble concentrating  0   Moving slowly or fidgety/restless  0   Suicidal thoughts  0   PHQ-9 Score  3   Difficult doing work/chores  Not difficult at all     History Manu has a past medical history of BPH (benign prostatic hyperplasia), DDD (degenerative disc disease), cervical, Hyperlipidemia, and Sleep apnea.   He has a past surgical history that includes Hernia repair (1995); Appendectomy (1981); and LASIK.   His family history is not on file. He was adopted.He reports that he has never smoked. He has never used smokeless tobacco. He reports current alcohol use of about 28.0 standard drinks of alcohol per week. He reports that he does not use drugs.    ROS Review of Systems  Constitutional:  Positive for fatigue (mild. Feels it is related to lack of water intake and alcoholconsumption). Negative for activity change and unexpected weight change.  HENT:  Positive for postnasal drip, rhinorrhea (left nare) and sore throat (feels swollen inside). Negative for congestion, ear pain, hearing loss and trouble swallowing.   Eyes:  Negative for pain and visual disturbance.  Respiratory:  Negative for cough, chest tightness and shortness of breath.   Cardiovascular:  Negative for chest pain, palpitations and leg swelling.  Gastrointestinal:  Negative for abdominal distention,  abdominal pain, blood in stool, constipation, diarrhea, nausea and vomiting.  Endocrine: Negative for cold intolerance, heat intolerance and polydipsia.  Genitourinary:  Positive for penile pain (intermittent achiness with red bumps on shaft. White flakinessas well. Onset 6 weeks ago.). Negative for difficulty urinating, dysuria, flank pain, frequency and urgency.  Musculoskeletal:  Positive for arthralgias (arms and legs achy a lot) and back pain (low back pain, jarred back when he fell 2 feet. getting a sharp pain at the left flank area.). Negative for joint swelling.  Skin:  Negative for color change, rash and wound.  Neurological:  Negative for dizziness, syncope, speech difficulty, weakness, light-headedness, numbness and headaches.  Hematological:  Does not bruise/bleed easily.  Psychiatric/Behavioral:  Negative for confusion, decreased concentration, dysphoric mood and sleep disturbance. The patient is not nervous/anxious.     Objective:  BP 123/69   Pulse 77   Temp 99 F (37.2 C)   Ht _0  (1.727 m)   Wt 193 lb (87.5 kg)   SpO2 100%   BMI 29.35 kg/m   BP Readings from Last 3 Encounters:  11/20/21 123/69  10/07/21 130/79  08/11/21 112/74    Wt Readings from Last 3 Encounters:  11/20/21 193 lb (87.5 kg)  10/07/21 189 lb 9.6 oz (86 kg)  08/11/21 193 lb 6.4 oz (87.7 kg)  Physical Exam Constitutional:      Appearance: He is well-developed.  HENT:     Head: Normocephalic and atraumatic.  Eyes:     Pupils: Pupils are equal, round, and reactive to light.  Neck:     Thyroid: No thyromegaly.     Trachea: No tracheal deviation.  Cardiovascular:     Rate and Rhythm: Normal rate and regular rhythm.     Heart sounds: Normal heart sounds. No murmur heard.    No friction rub. No gallop.  Pulmonary:     Breath sounds: Normal breath sounds. No wheezing or rales.  Abdominal:     General: Bowel sounds are normal. There is no distension.     Palpations: Abdomen is soft.  There is no mass.     Tenderness: There is no abdominal tenderness.     Hernia: There is no hernia in the left inguinal area or right inguinal area.  Genitourinary:    Penis: Erythema (with erythematous papules on glans) present.      Testes: Normal.     Epididymis:     Left: No tenderness.     Prostate: Not enlarged and not tender.     Rectum: Normal.  Musculoskeletal:        General: Normal range of motion.     Cervical back: Normal range of motion.  Lymphadenopathy:     Cervical: No cervical adenopathy.  Skin:    General: Skin is warm and dry.  Neurological:     Mental Status: He is alert and oriented to person, place, and time.       Assessment & Plan:   Nikolos was seen today for annual exam.  Diagnoses and all orders for this visit:  Well adult exam -     CBC with Differential/Platelet -     CMP14+EGFR -     Lipid panel  Mixed hyperlipidemia -     fenofibrate 160 MG tablet; Take 1 tablet (160 mg total) by mouth daily. For cholesterol and triglyceride -     CBC with Differential/Platelet -     CMP14+EGFR -     Lipid panel  OSA (obstructive sleep apnea) -     CBC with Differential/Platelet -     CMP14+EGFR  Non-seasonal allergic rhinitis, unspecified trigger -     CBC with Differential/Platelet -     CMP14+EGFR -     Ambulatory referral to ENT  Vitamin D deficiency -     VITAMIN D 25 Hydroxy (Vit-D Deficiency, Fractures)  Statin intolerance  Loss of libido -     Testosterone,Free and Total  Erectile dysfunction due to arterial insufficiency  Benign prostatic hyperplasia, unspecified whether lower urinary tract symptoms present  Balanitis  Strain of lumbar region, initial encounter  Acute maxillary sinusitis, recurrence not specified -     Ambulatory referral to ENT  Umbilical hernia without obstruction and without gangrene -     Ambulatory referral to General Surgery  Other orders -     clotrimazole-betamethasone (LOTRISONE) cream; Apply 1  Application topically 2 (two) times daily. To affected areas until rash clears -     tiZANidine (ZANAFLEX) 4 MG tablet; Take 1 tablet (4 mg total) by mouth every 6 (six) hours as needed for muscle spasms. -     amoxicillin-clavulanate (AUGMENTIN) 875-125 MG tablet; Take 1 tablet by mouth 2 (two) times daily. Take all of this medication       I am having [name] [name] "[name]" start on clotrimazole-betamethasone,  tiZANidine, and amoxicillin-clavulanate. I am also having him maintain his fluticasone, Vitamin D3, diclofenac, Vardenafil HCl, doxycycline, and fenofibrate.  Allergies as of 11/20/2021       Reactions   Ivp Dye [iodinated Contrast Media] Hives   Other reaction(s): Unknown   Ciprofloxacin Other (See Comments)   Tingling in extremities, tinnitus, and mood disturbances   Statins Other (See Comments)   myalgias        Medication List        Accurate as of November 20, 2021 10:59 AM. If you have any questions, ask your nurse or doctor.          amoxicillin-clavulanate 875-125 MG tablet Commonly known as: AUGMENTIN Take 1 tablet by mouth 2 (two) times daily. Take all of this medication Started by: Claretta Fraise, MD   clotrimazole-betamethasone cream Commonly known as: Lotrisone Apply 1 Application topically 2 (two) times daily. To affected areas until rash clears Started by: Claretta Fraise, MD   diclofenac 75 MG EC tablet Commonly known as: VOLTAREN Take 1 tablet (75 mg total) by mouth 2 (two) times daily. For muscle and  Joint pain   doxycycline 50 MG capsule Commonly known as: MONODOX Take 2 capsules (100 mg total) by mouth 2 (two) times daily.   fenofibrate 160 MG tablet Take 1 tablet (160 mg total) by mouth daily. For cholesterol and triglyceride   fluticasone 50 MCG/ACT nasal spray Commonly known as: FLONASE Place 2 sprays into both nostrils 2 (two) times daily.   tiZANidine 4 MG tablet Commonly known as: ZANAFLEX Take 1 tablet (4 mg total) by mouth  every 6 (six) hours as needed for muscle spasms. Started by: Claretta Fraise, MD   Vardenafil HCl 10 MG Tbdp TAKE 1 TABLET BY MOUTH EVERY DAY AS NEEDED   Vitamin D3 75 MCG (3000 UT) Tabs Take by mouth daily.         Follow-up: No follow-ups on file.  Claretta Fraise, M.D.

## 2021-11-21 ENCOUNTER — Other Ambulatory Visit: Payer: Self-pay

## 2021-11-21 MED ORDER — VARDENAFIL HCL 10 MG PO TBDP: 1.0000 | ORAL_TABLET | Freq: Every day | ORAL | 11 refills | Status: DC | PRN

## 2021-11-21 NOTE — Progress Notes (Signed)
Hello Christopher Rosales,  Your lab result is normal and/or stable.Some minor variations that are not significant are commonly marked abnormal, but do not represent any medical problem for you.  Best regards, Nysa Sarin, M.D.

## 2021-11-23 LAB — CMP14+EGFR
ALT: 20 IU/L (ref 0–44)
AST: 21 IU/L (ref 0–40)
Albumin/Globulin Ratio: 1.7 (ref 1.2–2.2)
Albumin: 4.5 g/dL (ref 3.8–4.9)
Alkaline Phosphatase: 57 IU/L (ref 44–121)
BUN/Creatinine Ratio: 16 (ref 9–20)
BUN: 18 mg/dL (ref 6–24)
Bilirubin Total: 0.5 mg/dL (ref 0.0–1.2)
CO2: 22 mmol/L (ref 20–29)
Calcium: 9.9 mg/dL (ref 8.7–10.2)
Chloride: 103 mmol/L (ref 96–106)
Creatinine, Ser: 1.11 mg/dL (ref 0.76–1.27)
Globulin, Total: 2.7 g/dL (ref 1.5–4.5)
Glucose: 94 mg/dL (ref 70–99)
Potassium: 4.8 mmol/L (ref 3.5–5.2)
Sodium: 140 mmol/L (ref 134–144)
Total Protein: 7.2 g/dL (ref 6.0–8.5)
eGFR: 77 mL/min/{1.73_m2} (ref >59)

## 2021-11-23 LAB — CBC WITH DIFFERENTIAL/PLATELET
Basophils Absolute: 0.1 10*3/uL (ref 0.0–0.2)
Basos: 1 %
EOS (ABSOLUTE): 0.3 10*3/uL (ref 0.0–0.4)
Eos: 5 %
Hematocrit: 43.9 % (ref 37.5–51.0)
Hemoglobin: 14.8 g/dL (ref 13.0–17.7)
Immature Grans (Abs): 0 10*3/uL (ref 0.0–0.1)
Immature Granulocytes: 1 %
Lymphocytes Absolute: 1.4 10*3/uL (ref 0.7–3.1)
Lymphs: 23 %
MCH: 31.6 pg (ref 26.6–33.0)
MCHC: 33.7 g/dL (ref 31.5–35.7)
MCV: 94 fL (ref 79–97)
Monocytes Absolute: 0.8 10*3/uL (ref 0.1–0.9)
Monocytes: 13 %
Neutrophils Absolute: 3.4 10*3/uL (ref 1.4–7.0)
Neutrophils: 57 %
Platelets: 289 10*3/uL (ref 150–450)
RBC: 4.68 x10E6/uL (ref 4.14–5.80)
RDW: 12.3 % (ref 11.6–15.4)
WBC: 5.9 10*3/uL (ref 3.4–10.8)

## 2021-11-23 LAB — LIPID PANEL
Chol/HDL Ratio: 4.1 ratio (ref 0.0–5.0)
Cholesterol, Total: 193 mg/dL (ref 100–199)
HDL: 47 mg/dL (ref >39)
LDL Chol Calc (NIH): 121 mg/dL — ABNORMAL HIGH (ref 0–99)
Triglycerides: 140 mg/dL (ref 0–149)
VLDL Cholesterol Cal: 25 mg/dL (ref 5–40)

## 2021-11-23 LAB — TESTOSTERONE,FREE AND TOTAL
Testosterone, Free: 7.3 pg/mL (ref 7.2–24.0)
Testosterone: 382 ng/dL (ref 264–916)

## 2021-11-23 LAB — VITAMIN D 25 HYDROXY (VIT D DEFICIENCY, FRACTURES): Vit D, 25-Hydroxy: 41.2 ng/mL (ref 30.0–100.0)

## 2022-01-01 ENCOUNTER — Ambulatory Visit: Payer: BC Managed Care – PPO | Admitting: General Surgery

## 2022-01-01 ENCOUNTER — Encounter: Payer: Self-pay | Admitting: General Surgery

## 2022-01-01 VITALS — BP 122/78 | HR 80 | Temp 98.4°F | Resp 12 | Ht 68.0 in | Wt 190.0 lb

## 2022-01-01 DIAGNOSIS — K429 Umbilical hernia without obstruction or gangrene: Secondary | ICD-10-CM | POA: Diagnosis not present

## 2022-01-01 NOTE — Progress Notes (Signed)
Christopher Rosales; [id number]; Feb 04, 1965   HPI Patient is a 56 year old white male who was referred to my care by Dr. Mechele Claude for evaluation and treatment of an umbilical hernia.  He has had the umbilical hernia for some time now, but it has increased in size and is causing him discomfort when straining.  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  He denies any nausea or vomiting.  He states the skin has thinned over the hernia.  It does reduce on its own. Past Medical History:  Diagnosis Date   BPH (benign prostatic hyperplasia)    DDD (degenerative disc disease), cervical    Hyperlipidemia    Sleep apnea     Past Surgical History:  Procedure Laterality Date   APPENDECTOMY  1981   HERNIA REPAIR  1995   LASIK      Family History  Adopted: Yes    Current Outpatient Medications on File Prior to Visit  Medication Sig Dispense Refill   clotrimazole-betamethasone (LOTRISONE) cream Apply 1 Application topically 2 (two) times daily. To affected areas until rash clears 45 g 1   diclofenac (VOLTAREN) 75 MG EC tablet Take 1 tablet (75 mg total) by mouth 2 (two) times daily. For muscle and  Joint pain 60 tablet 2   fenofibrate 160 MG tablet Take 1 tablet (160 mg total) by mouth daily. For cholesterol and triglyceride 90 tablet 3   Vardenafil HCl 10 MG TBDP Take 1 tablet by mouth daily as needed. 8 tablet 11   No current facility-administered medications on file prior to visit.    Allergies  Allergen Reactions   Ivp Dye [Iodinated Contrast Media] Hives    Other reaction(s): Unknown   Ciprofloxacin Other (See Comments)    Tingling in extremities, tinnitus, and mood disturbances   Statins Other (See Comments)    myalgias    Social History   Substance and Sexual Activity  Alcohol Use Yes   Alcohol/week: 28.0 standard drinks of alcohol   Types: 28 Cans of beer per week    Social History   Tobacco Use  Smoking Status Never  Smokeless Tobacco Never    Review of  Systems  Constitutional: Negative.   HENT: Negative.    Eyes: Negative.   Respiratory: Negative.    Cardiovascular: Negative.   Gastrointestinal: Negative.   Genitourinary: Negative.   Musculoskeletal: Negative.   Skin: Negative.   Neurological: Negative.   Endo/Heme/Allergies: Negative.   Psychiatric/Behavioral: Negative.      Objective   Vitals:   01/01/22 0905  BP: 122/78  Pulse: 80  Resp: 12  Temp: 98.4 F (36.9 C)  SpO2: 98%    Physical Exam Vitals reviewed.  Constitutional:      Appearance: Normal appearance. He is not ill-appearing.  HENT:     Head: Normocephalic and atraumatic.  Cardiovascular:     Rate and Rhythm: Normal rate and regular rhythm.     Heart sounds: Normal heart sounds. No murmur heard.    No friction rub. No gallop.  Pulmonary:     Effort: Pulmonary effort is normal. No respiratory distress.     Breath sounds: Normal breath sounds. No stridor. No wheezing, rhonchi or rales.  Abdominal:     General: Bowel sounds are normal. There is no distension.     Palpations: Abdomen is soft. There is no mass.     Tenderness: There is no abdominal tenderness. There is no guarding or rebound.  Hernia: A hernia is present.     Comments: 4 cm reducible umbilical hernia is present.  The skin is thin over the hernia.  Skin:    General: Skin is warm and dry.  Neurological:     Mental Status: He is alert and oriented to person, place, and time.    Primary care notes reviewed Assessment  Umbilical hernia Plan  Patient is scheduled for robotic assisted umbilical herniorrhaphy with mesh on 02/09/2022.  The risks and benefits of the procedure including bleeding, infection, recurrence of the hernia, and the possibility of an open procedure were fully explained to the patient, who gave informed consent.

## 2022-01-15 NOTE — H&P (Signed)
Christopher Rosales; [id number]; Feb 04, 1965   HPI Patient is a 56 year old white male who was referred to my care by Dr. Mechele Claude for evaluation and treatment of an umbilical hernia.  He has had the umbilical hernia for some time now, but it has increased in size and is causing him discomfort when straining.  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  He denies any nausea or vomiting.  He states the skin has thinned over the hernia.  It does reduce on its own. Past Medical History:  Diagnosis Date   BPH (benign prostatic hyperplasia)    DDD (degenerative disc disease), cervical    Hyperlipidemia    Sleep apnea     Past Surgical History:  Procedure Laterality Date   APPENDECTOMY  1981   HERNIA REPAIR  1995   LASIK      Family History  Adopted: Yes    Current Outpatient Medications on File Prior to Visit  Medication Sig Dispense Refill   clotrimazole-betamethasone (LOTRISONE) cream Apply 1 Application topically 2 (two) times daily. To affected areas until rash clears 45 g 1   diclofenac (VOLTAREN) 75 MG EC tablet Take 1 tablet (75 mg total) by mouth 2 (two) times daily. For muscle and  Joint pain 60 tablet 2   fenofibrate 160 MG tablet Take 1 tablet (160 mg total) by mouth daily. For cholesterol and triglyceride 90 tablet 3   Vardenafil HCl 10 MG TBDP Take 1 tablet by mouth daily as needed. 8 tablet 11   No current facility-administered medications on file prior to visit.    Allergies  Allergen Reactions   Ivp Dye [Iodinated Contrast Media] Hives    Other reaction(s): Unknown   Ciprofloxacin Other (See Comments)    Tingling in extremities, tinnitus, and mood disturbances   Statins Other (See Comments)    myalgias    Social History   Substance and Sexual Activity  Alcohol Use Yes   Alcohol/week: 28.0 standard drinks of alcohol   Types: 28 Cans of beer per week    Social History   Tobacco Use  Smoking Status Never  Smokeless Tobacco Never    Review of  Systems  Constitutional: Negative.   HENT: Negative.    Eyes: Negative.   Respiratory: Negative.    Cardiovascular: Negative.   Gastrointestinal: Negative.   Genitourinary: Negative.   Musculoskeletal: Negative.   Skin: Negative.   Neurological: Negative.   Endo/Heme/Allergies: Negative.   Psychiatric/Behavioral: Negative.      Objective   Vitals:   01/01/22 0905  BP: 122/78  Pulse: 80  Resp: 12  Temp: 98.4 F (36.9 C)  SpO2: 98%    Physical Exam Vitals reviewed.  Constitutional:      Appearance: Normal appearance. He is not ill-appearing.  HENT:     Head: Normocephalic and atraumatic.  Cardiovascular:     Rate and Rhythm: Normal rate and regular rhythm.     Heart sounds: Normal heart sounds. No murmur heard.    No friction rub. No gallop.  Pulmonary:     Effort: Pulmonary effort is normal. No respiratory distress.     Breath sounds: Normal breath sounds. No stridor. No wheezing, rhonchi or rales.  Abdominal:     General: Bowel sounds are normal. There is no distension.     Palpations: Abdomen is soft. There is no mass.     Tenderness: There is no abdominal tenderness. There is no guarding or rebound.  Hernia: A hernia is present.     Comments: 4 cm reducible umbilical hernia is present.  The skin is thin over the hernia.  Skin:    General: Skin is warm and dry.  Neurological:     Mental Status: He is alert and oriented to person, place, and time.    Primary care notes reviewed Assessment  Umbilical hernia Plan  Patient is scheduled for robotic assisted umbilical herniorrhaphy with mesh on 02/09/2022.  The risks and benefits of the procedure including bleeding, infection, recurrence of the hernia, and the possibility of an open procedure were fully explained to the patient, who gave informed consent.

## 2022-02-02 NOTE — Patient Instructions (Signed)
Christopher Rosales  02/02/2022     @PREFPERIOPPHARMACY @   Your procedure is scheduled on 02/09/22.  Report to [facility] at 6:00 A.M.  Call this number if you have problems the morning of surgery:  [phone]  If you experience any cold or flu symptoms such as cough, fever, chills, shortness of breath, etc. between now and your scheduled surgery, please notify us at the above number.   Remember:  Do not eat or drink after midnight.      Take these medicines the morning of surgery with A SIP OF WATER : Voltaren if needed    Do not wear jewelry, make-up or nail polish.  Do not wear lotions, powders, or perfumes, or deodorant.  Do not shave 48 hours prior to surgery.  Men may shave face and neck.  Do not bring valuables to the hospital.  [facility] is not responsible for any belongings or valuables.  Contacts, dentures or bridgework may not be worn into surgery.  Leave your suitcase in the car.  After surgery it may be brought to your room.  For patients admitted to the hospital, discharge time will be determined by your treatment team.  Patients discharged the day of surgery will not be allowed to drive home.   Name and phone number of your driver:   Family Special instructions:  N/A  Please read over the following fact sheets that you were given.   Care and Recovery After Surgery Laparoscopic Inguinal Hernia Repair, Adult Laparoscopic inguinal hernia repair is a surgical procedure to repair a small, weak spot in the groin muscles that allows fat or intestines from inside the abdomen to bulge out (inguinal hernia). This procedure may be planned, or it may be an emergency procedure. During the procedure, tissue that has bulged out is moved back into place, and the opening in the groin muscles is repaired. This is done through three small incisions in the abdomen. A thin tube with a light and camera on the end (laparoscope) is used to help perform the procedure. Tell a health care  provider about: Any allergies you have. All medicines you are taking, including vitamins, herbs, eye drops, creams, and over-the-counter medicines. Any problems you or family members have had with anesthetic medicines. Any blood disorders you have. Any surgeries you have had. Any medical conditions you have. Whether you are pregnant or may be pregnant. What are the risks? Generally, this is a safe procedure. However, problems may occur, including: Infection. Bleeding. Allergic reactions to medicines. Damage to nearby structures or organs. Testicle damage or long-term pain and swelling of the scrotum, in males. Inability to completely empty the bladder (urinary retention). Blood clots. A collection of fluid that builds up under the skin (seroma). The hernia coming back (recurrence). What happens before the procedure? Staying hydrated Follow instructions from your health care provider about hydration, which may include: Up to 2 hours before the procedure - you may continue to drink clear liquids, such as water, clear fruit juice, black coffee, and plain tea.  Eating and drinking restrictions Follow instructions from your health care provider about eating and drinking, which may include: 8 hours before the procedure - stop eating heavy meals or foods, such as meat, fried foods, or fatty foods. 6 hours before the procedure - stop eating light meals or foods, such as toast or cereal. 6 hours before the procedure - stop drinking milk or drinks that contain milk. 2 hours before the procedure - stop  drinking clear liquids. Medicines Ask your health care provider about: Changing or stopping your regular medicines. This is especially important if you are taking diabetes medicines or blood thinners. Taking medicines such as aspirin and ibuprofen. These medicines can thin your blood. Do not take these medicines unless your health care provider tells you to take them. Taking over-the-counter  medicines, vitamins, herbs, and supplements. General instructions Do not use any products that contain nicotine or tobacco for at least 4 weeks before the procedure, if possible. These products include cigarettes, chewing tobacco, and vaping devices, such as e-cigarettes. If you need help quitting, ask your health care provider. Ask your health care provider: How your surgery site will be marked. What steps will be taken to help prevent infection. These steps may include: Removing hair at the surgery site. Washing skin with a germ-killing soap. Taking antibiotic medicine. Plan to have a responsible adult take you home from the hospital or clinic. Plan to have a responsible adult care for you for the time you are told after you leave the hospital or clinic. This is important. What happens during the procedure? An IV will be inserted into one of your veins. You will be given one or more of the following: A medicine to help you relax (sedative). A medicine to make you fall asleep (general anesthetic). Three small incisions will be made in your abdomen. Your abdomen will be inflated with carbon dioxide gas to make the surgical area easier to see. A laparoscope and surgical instruments will be inserted through the incisions. The laparoscope will send images of the inside of your abdomen to a monitor in the room. Tissue that is bulging through the hernia may be removed or moved back into place. The hernia opening will be closed with a sheet of surgical mesh. The surgical instruments and laparoscope will be removed. Your incisions will be closed with stitches (sutures) and adhesive strips. A bandage (dressing) will be placed over your incisions. The procedure may vary among health care providers and hospitals. What happens after the procedure? Your blood pressure, heart rate, breathing rate, and blood oxygen level will be monitored until you leave the hospital or clinic. You will be given pain  medicine as needed. You may continue to receive medicines and fluids through an IV. The IV will be removed after you can drink fluids. You will be encouraged to get up and move around and to take deep breaths frequently. If you were given a sedative during the procedure, it can affect you for several hours. Do not drive or operate machinery until your health care provider says that it is safe. Summary Laparoscopic inguinal hernia repair is a surgical procedure to repair a small, weak spot in the groin muscles that allows fat or intestines from inside the abdomen to bulge out (inguinal hernia). This procedure is done through three small incisions in the abdomen. A thin tube with a light and camera on the end (laparoscope) is used to help perform the procedure. After the procedure, you will be encouraged to get up and move around and to take deep breaths frequently. This information is not intended to replace advice given to you by your health care provider. Make sure you discuss any questions you have with your health care provider. Document Revised: 09/05/2019 Document Reviewed: 09/05/2019 Elsevier Patient Education  2023 [employer]  General Anesthesia, Adult General anesthesia is the use of medicine to make you fall asleep (unconscious) for a medical procedure. General anesthesia must  be used for certain procedures. It is often recommended for surgery or procedures that: Last a long time. Require you to be still or in an unusual position. Are major and can cause blood loss. Affect your breathing. The medicines used for general anesthesia are called general anesthetics. During general anesthesia, these medicines are given along with medicines that: Prevent pain. Control your blood pressure. Relax your muscles. Prevent nausea and vomiting after the procedure. Tell a health care provider about: Any allergies you have. All medicines you are taking, including vitamins, herbs, eye drops,  creams, and over-the-counter medicines. Your history of any: Medical conditions you have, including: High blood pressure. Bleeding problems. Diabetes. Heart or lung conditions, such as: Heart failure. Sleep apnea. Asthma. Chronic obstructive pulmonary disease (COPD). Current or recent illnesses, such as: Upper respiratory, chest, or ear infections. Cough or fever. Tobacco or drug use, including marijuana or alcohol use. Depression or anxiety. Surgeries and types of anesthetics you have had. Problems you or family members have had with anesthetic medicines. Whether you are pregnant or may be pregnant. Whether you have any chipped or loose teeth, dentures, caps, bridgework, or issues with your mouth, swallowing, or choking. What are the risks? Your health care provider will talk with you about risks. These may include: Allergic reaction to the medicines. Lung and heart problems. Inhaling food or liquid from the stomach into the lungs (aspiration). Nerve injury. Injury to the lips, mouth, teeth, or gums. Stroke. Waking up during your procedure and being unable to move. This is rare. These problems are more likely to develop if you are having a major surgery or if you have an advanced or serious medical condition. You can prevent some of these complications by answering all of your health care provider's questions thoroughly and by following all instructions before your procedure. General anesthesia can cause side effects, including: Nausea or vomiting. A sore throat or hoarseness from the breathing tube. Wheezing or coughing. Shaking chills or feeling cold. Body aches. Sleepiness. Confusion, agitation (delirium), or anxiety. What happens before the procedure? When to stop eating and drinking Follow instructions from your health care provider about what you may eat and drink before your procedure. If you do not follow your health care provider's instructions, your procedure may  be delayed or canceled. Medicines Ask your health care provider about: Changing or stopping your regular medicines. These include any diabetes medicines or blood thinners you take. Taking medicines such as aspirin and ibuprofen. These medicines can thin your blood. Do not take them unless your health care provider tells you to. Taking over-the-counter medicines, vitamins, herbs, and supplements. General instructions Do not use any products that contain nicotine or tobacco for at least 4 weeks before the procedure. These products include cigarettes, chewing tobacco, and vaping devices, such as e-cigarettes. If you need help quitting, ask your health care provider. If you brush your teeth on the morning of the procedure, make sure to spit out all of the water and toothpaste. If told by your health care provider, bring your sleep apnea device with you to surgery (if applicable). If you will be going home right after the procedure, plan to have a responsible adult: Take you home from the hospital or clinic. You will not be allowed to drive. Care for you for the time you are told. What happens during the procedure?  An IV will be inserted into one of your veins. You will be given one or more of the following through a face  mask or IV: A sedative. This helps you relax. Anesthesia. This will: Numb certain areas of your body. Make you fall asleep for surgery. After you are unconscious, a breathing tube may be inserted down your throat to help you breathe. This will be removed before you wake up. An anesthesia provider, such as an anesthesiologist, will stay with you throughout your procedure. The anesthesia provider will: Keep you comfortable and safe by continuing to give you medicines and adjusting the amount of medicine that you get. Monitor your blood pressure, heart rate, and oxygen levels to make sure that the anesthetics do not cause any problems. The procedure may vary among health care  providers and hospitals. What happens after the procedure? Your blood pressure, temperature, heart rate, breathing rate, and blood oxygen level will be monitored until you leave the hospital or clinic. You will wake up in a recovery area. You may wake up slowly. You may be given medicine to help you with pain, nausea, or any other side effects from the anesthesia. Summary General anesthesia is the use of medicine to make you fall asleep (unconscious) for a medical procedure. Follow your health care provider's instructions about when to stop eating, drinking, or taking certain medicines before your procedure. Plan to have a responsible adult take you home from the hospital or clinic. This information is not intended to replace advice given to you by your health care provider. Make sure you discuss any questions you have with your health care provider. Document Revised: 04/03/2021 Document Reviewed: 04/03/2021 Elsevier Patient Education  2023 [employer] Inc[employer]  How to Use Chlorhexidine Before Surgery Chlorhexidine gluconate (CHG) is a germ-killing (antiseptic) solution that is used to clean the skin. It can get rid of the bacteria that normally live on the skin and can keep them away for about 24 hours. To clean your skin with CHG, you may be given: A CHG solution to use in the shower or as part of a sponge bath. A prepackaged cloth that contains CHG. Cleaning your skin with CHG may help lower the risk for infection: While you are staying in the intensive care unit of the hospital. If you have a vascular access, such as a central line, to provide short-term or long-term access to your veins. If you have a catheter to drain urine from your bladder. If you are on a ventilator. A ventilator is a machine that helps you breathe by moving air in and out of your lungs. After surgery. What are the risks? Risks of using CHG include: A skin reaction. Hearing loss, if CHG gets in your ears and you have a  perforated eardrum. Eye injury, if CHG gets in your eyes and is not rinsed out. The CHG product catching fire. Make sure that you avoid smoking and flames after applying CHG to your skin. Do not use CHG: If you have a chlorhexidine allergy or have previously reacted to chlorhexidine. On babies younger than 69 months of age. How to use CHG solution Use CHG only as told by your health care provider, and follow the instructions on the label. Use the full amount of CHG as directed. Usually, this is one bottle. During a shower Follow these steps when using CHG solution during a shower (unless your health care provider gives you different instructions): Start the shower. Use your normal soap and shampoo to wash your face and hair. Turn off the shower or move out of the shower stream. Pour the CHG onto a clean washcloth. Do  not use any type of brush or rough-edged sponge. Starting at your neck, lather your body down to your toes. Make sure you follow these instructions: If you will be having surgery, pay special attention to the part of your body where you will be having surgery. Scrub this area for at least 1 minute. Do not use CHG on your head or face. If the solution gets into your ears or eyes, rinse them well with water. Avoid your genital area. Avoid any areas of skin that have broken skin, cuts, or scrapes. Scrub your back and under your arms. Make sure to wash skin folds. Let the lather sit on your skin for 1-2 minutes or as long as told by your health care provider. Thoroughly rinse your entire body in the shower. Make sure that all body creases and crevices are rinsed well. Dry off with a clean towel. Do not put any substances on your body afterward--such as powder, lotion, or perfume--unless you are told to do so by your health care provider. Only use lotions that are recommended by the manufacturer. Put on clean clothes or pajamas. If it is the night before your surgery, sleep in clean  sheets.  During a sponge bath Follow these steps when using CHG solution during a sponge bath (unless your health care provider gives you different instructions): Use your normal soap and shampoo to wash your face and hair. Pour the CHG onto a clean washcloth. Starting at your neck, lather your body down to your toes. Make sure you follow these instructions: If you will be having surgery, pay special attention to the part of your body where you will be having surgery. Scrub this area for at least 1 minute. Do not use CHG on your head or face. If the solution gets into your ears or eyes, rinse them well with water. Avoid your genital area. Avoid any areas of skin that have broken skin, cuts, or scrapes. Scrub your back and under your arms. Make sure to wash skin folds. Let the lather sit on your skin for 1-2 minutes or as long as told by your health care provider. Using a different clean, wet washcloth, thoroughly rinse your entire body. Make sure that all body creases and crevices are rinsed well. Dry off with a clean towel. Do not put any substances on your body afterward--such as powder, lotion, or perfume--unless you are told to do so by your health care provider. Only use lotions that are recommended by the manufacturer. Put on clean clothes or pajamas. If it is the night before your surgery, sleep in clean sheets. How to use CHG prepackaged cloths Only use CHG cloths as told by your health care provider, and follow the instructions on the label. Use the CHG cloth on clean, dry skin. Do not use the CHG cloth on your head or face unless your health care provider tells you to. When washing with the CHG cloth: Avoid your genital area. Avoid any areas of skin that have broken skin, cuts, or scrapes. Before surgery Follow these steps when using a CHG cloth to clean before surgery (unless your health care provider gives you different instructions): Using the CHG cloth, vigorously scrub the  part of your body where you will be having surgery. Scrub using a back-and-forth motion for 3 minutes. The area on your body should be completely wet with CHG when you are done scrubbing. Do not rinse. Discard the cloth and let the area air-dry. Do not put any  substances on the area afterward, such as powder, lotion, or perfume. Put on clean clothes or pajamas. If it is the night before your surgery, sleep in clean sheets.  For general bathing Follow these steps when using CHG cloths for general bathing (unless your health care provider gives you different instructions). Use a separate CHG cloth for each area of your body. Make sure you wash between any folds of skin and between your fingers and toes. Wash your body in the following order, switching to a new cloth after each step: The front of your neck, shoulders, and chest. Both of your arms, under your arms, and your hands. Your stomach and groin area, avoiding the genitals. Your right leg and foot. Your left leg and foot. The back of your neck, your back, and your buttocks. Do not rinse. Discard the cloth and let the area air-dry. Do not put any substances on your body afterward--such as powder, lotion, or perfume--unless you are told to do so by your health care provider. Only use lotions that are recommended by the manufacturer. Put on clean clothes or pajamas. Contact a health care provider if: Your skin gets irritated after scrubbing. You have questions about using your solution or cloth. You swallow any chlorhexidine. Call your local poison control center [phone] in the [place] Get help right away if: Your eyes itch badly, or they become very red or swollen. Your skin itches badly and is red or swollen. Your hearing changes. You have trouble seeing. You have swelling or tingling in your mouth or throat. You have trouble breathing. These symptoms may represent a serious problem that is an emergency. Do not wait to see if the  symptoms will go away. Get medical help right away. Call your local emergency services (911 in the [place] Do not drive yourself to the hospital. Summary Chlorhexidine gluconate (CHG) is a germ-killing (antiseptic) solution that is used to clean the skin. Cleaning your skin with CHG may help to lower your risk for infection. You may be given CHG to use for bathing. It may be in a bottle or in a prepackaged cloth to use on your skin. Carefully follow your health care provider's instructions and the instructions on the product label. Do not use CHG if you have a chlorhexidine allergy. Contact your health care provider if your skin gets irritated after scrubbing. This information is not intended to replace advice given to you by your health care provider. Make sure you discuss any questions you have with your health care provider. Document Revised: 05/05/2021 Document Reviewed: 03/18/2020 Elsevier Patient Education  2023 [employer].

## 2022-02-04 ENCOUNTER — Encounter (HOSPITAL_COMMUNITY)
Admission: RE | Admit: 2022-02-04 | Discharge: 2022-02-04 | Disposition: A | Discharge status: Home / Self Care | Payer: BC Managed Care – PPO | Source: Ambulatory Visit | Attending: General Surgery | Admitting: General Surgery

## 2022-02-04 ENCOUNTER — Encounter (HOSPITAL_COMMUNITY): Payer: Self-pay

## 2022-02-04 DIAGNOSIS — Z01818 Encounter for other preprocedural examination: Secondary | ICD-10-CM

## 2022-02-04 LAB — BASIC METABOLIC PANEL
Anion gap: 7 (ref 5–15)
BUN: 21 mg/dL — ABNORMAL HIGH (ref 6–20)
CO2: 25 mmol/L (ref 22–32)
Calcium: 9.4 mg/dL (ref 8.9–10.3)
Chloride: 104 mmol/L (ref 98–111)
Creatinine, Ser: 1.2 mg/dL (ref 0.61–1.24)
GFR, Estimated: >60 mL/min (ref >60)
Glucose, Bld: 114 mg/dL — ABNORMAL HIGH (ref 70–99)
Potassium: 3.7 mmol/L (ref 3.5–5.1)
Sodium: 136 mmol/L (ref 135–145)

## 2022-02-04 LAB — CBC WITH DIFFERENTIAL/PLATELET
Abs Immature Granulocytes: 0.01 10*3/uL (ref 0.00–0.07)
Basophils Absolute: 0.1 10*3/uL (ref 0.0–0.1)
Basophils Relative: 1 %
Eosinophils Absolute: 0.2 10*3/uL (ref 0.0–0.5)
Eosinophils Relative: 2 %
HCT: 45.1 % (ref 39.0–52.0)
Hemoglobin: 14.6 g/dL (ref 13.0–17.0)
Immature Granulocytes: 0 %
Lymphocytes Relative: 30 %
Lymphs Abs: 1.9 10*3/uL (ref 0.7–4.0)
MCH: 31 pg (ref 26.0–34.0)
MCHC: 32.4 g/dL (ref 30.0–36.0)
MCV: 95.8 fL (ref 80.0–100.0)
Monocytes Absolute: 0.8 10*3/uL (ref 0.1–1.0)
Monocytes Relative: 13 %
Neutro Abs: 3.3 10*3/uL (ref 1.7–7.7)
Neutrophils Relative %: 54 %
Platelets: 267 10*3/uL (ref 150–400)
RBC: 4.71 MIL/uL (ref 4.22–5.81)
RDW: 14.5 % (ref 11.5–15.5)
WBC: 6.2 10*3/uL (ref 4.0–10.5)
nRBC: 0 % (ref 0.0–0.2)

## 2022-02-09 ENCOUNTER — Ambulatory Visit (HOSPITAL_COMMUNITY)
Admission: RE | Admit: 2022-02-09 | Discharge: 2022-02-09 | Disposition: A | Discharge status: Home / Self Care | Payer: BC Managed Care – PPO | Attending: General Surgery | Admitting: General Surgery

## 2022-02-09 ENCOUNTER — Ambulatory Visit (HOSPITAL_COMMUNITY): Payer: BC Managed Care – PPO | Admitting: Anesthesiology

## 2022-02-09 ENCOUNTER — Other Ambulatory Visit: Payer: Self-pay

## 2022-02-09 ENCOUNTER — Encounter (HOSPITAL_COMMUNITY)
Admission: RE | Disposition: A | Discharge status: Home / Self Care | Payer: Self-pay | Source: Home / Self Care | Attending: General Surgery

## 2022-02-09 ENCOUNTER — Encounter (HOSPITAL_COMMUNITY): Payer: Self-pay | Admitting: General Surgery

## 2022-02-09 DIAGNOSIS — Z01818 Encounter for other preprocedural examination: Secondary | ICD-10-CM

## 2022-02-09 DIAGNOSIS — K429 Umbilical hernia without obstruction or gangrene: Secondary | ICD-10-CM | POA: Diagnosis not present

## 2022-02-09 DIAGNOSIS — G4730 Sleep apnea, unspecified: Secondary | ICD-10-CM | POA: Diagnosis not present

## 2022-02-09 SURGERY — REPAIR, HERNIA, UMBILICAL, ROBOT-ASSISTED
Anesthesia: General | Site: Abdomen

## 2022-02-09 MED ORDER — CHLORHEXIDINE GLUCONATE 0.12 % MT SOLN
15.0000 mL | Freq: Once | OROMUCOSAL | Status: AC
  Administered 2022-02-09: 15 mL via OROMUCOSAL

## 2022-02-09 MED ORDER — DEXMEDETOMIDINE HCL IN NACL 80 MCG/20ML IV SOLN
INTRAVENOUS | Status: DC | PRN
  Administered 2022-02-09 (×2): 10 ug via BUCCAL

## 2022-02-09 MED ORDER — SUGAMMADEX SODIUM 200 MG/2ML IV SOLN
INTRAVENOUS | Status: DC | PRN
  Administered 2022-02-09: 344.8 mg via INTRAVENOUS

## 2022-02-09 MED ORDER — LIDOCAINE 2% (20 MG/ML) 5 ML SYRINGE
INTRAMUSCULAR | Status: DC | PRN
  Administered 2022-02-09: 80 mg via INTRAVENOUS

## 2022-02-09 MED ORDER — BUPIVACAINE LIPOSOME 1.3 % IJ SUSP
INTRAMUSCULAR | Status: DC | PRN
  Administered 2022-02-09: 20 mL

## 2022-02-09 MED ORDER — HYDROMORPHONE HCL 1 MG/ML IJ SOLN: 0.2500 mg | INTRAMUSCULAR | Status: DC | PRN

## 2022-02-09 MED ORDER — FENTANYL CITRATE (PF) 250 MCG/5ML IJ SOLN
INTRAMUSCULAR | Status: DC | PRN
  Administered 2022-02-09: 50 ug via INTRAVENOUS
  Administered 2022-02-09: 100 ug via INTRAVENOUS
  Administered 2022-02-09 (×2): 50 ug via INTRAVENOUS

## 2022-02-09 MED ORDER — ONDANSETRON HCL 4 MG/2ML IJ SOLN: 4.0000 mg | Freq: Once | INTRAMUSCULAR | Status: DC | PRN

## 2022-02-09 MED ORDER — DEXAMETHASONE SODIUM PHOSPHATE 10 MG/ML IJ SOLN
INTRAMUSCULAR | Status: AC
  Filled 2022-02-09: qty 1

## 2022-02-09 MED ORDER — ROCURONIUM BROMIDE 10 MG/ML (PF) SYRINGE
PREFILLED_SYRINGE | INTRAVENOUS | Status: AC
  Filled 2022-02-09: qty 10

## 2022-02-09 MED ORDER — MEPERIDINE HCL 50 MG/ML IJ SOLN: 6.2500 mg | INTRAMUSCULAR | Status: DC | PRN

## 2022-02-09 MED ORDER — DEXAMETHASONE SODIUM PHOSPHATE 10 MG/ML IJ SOLN
INTRAMUSCULAR | Status: DC | PRN
  Administered 2022-02-09: 10 mg via INTRAVENOUS

## 2022-02-09 MED ORDER — PROPOFOL 10 MG/ML IV BOLUS
INTRAVENOUS | Status: AC
  Filled 2022-02-09: qty 20

## 2022-02-09 MED ORDER — CHLORHEXIDINE GLUCONATE CLOTH 2 % EX PADS: 6.0000 | MEDICATED_PAD | Freq: Once | CUTANEOUS | Status: DC

## 2022-02-09 MED ORDER — CEFAZOLIN SODIUM-DEXTROSE 2-4 GM/100ML-% IV SOLN
2.0000 g | INTRAVENOUS | Status: AC
  Administered 2022-02-09: 2 g via INTRAVENOUS
  Filled 2022-02-09: qty 100

## 2022-02-09 MED ORDER — MIDAZOLAM HCL 2 MG/2ML IJ SOLN
INTRAMUSCULAR | Status: AC
  Filled 2022-02-09: qty 2

## 2022-02-09 MED ORDER — FENTANYL CITRATE (PF) 250 MCG/5ML IJ SOLN
INTRAMUSCULAR | Status: AC
  Filled 2022-02-09: qty 5

## 2022-02-09 MED ORDER — KETOROLAC TROMETHAMINE 30 MG/ML IJ SOLN
INTRAMUSCULAR | Status: DC | PRN
  Administered 2022-02-09: 15 mg via INTRAVENOUS

## 2022-02-09 MED ORDER — LACTATED RINGERS IV SOLN: INTRAVENOUS | Status: DC

## 2022-02-09 MED ORDER — STERILE WATER FOR IRRIGATION IR SOLN
Status: DC | PRN
  Administered 2022-02-09: 500 mL

## 2022-02-09 MED ORDER — ROCURONIUM BROMIDE 10 MG/ML (PF) SYRINGE
PREFILLED_SYRINGE | INTRAVENOUS | Status: DC | PRN
  Administered 2022-02-09 (×2): 30 mg via INTRAVENOUS
  Administered 2022-02-09: 70 mg via INTRAVENOUS

## 2022-02-09 MED ORDER — OXYCODONE HCL 5 MG PO TABS: 5.0000 mg | ORAL_TABLET | ORAL | 0 refills | Status: DC | PRN

## 2022-02-09 MED ORDER — ORAL CARE MOUTH RINSE: 15.0000 mL | Freq: Once | OROMUCOSAL | Status: AC

## 2022-02-09 MED ORDER — MIDAZOLAM HCL 5 MG/5ML IJ SOLN
INTRAMUSCULAR | Status: DC | PRN
  Administered 2022-02-09: 2 mg via INTRAVENOUS

## 2022-02-09 MED ORDER — ONDANSETRON HCL 4 MG/2ML IJ SOLN
INTRAMUSCULAR | Status: AC
  Filled 2022-02-09: qty 2

## 2022-02-09 MED ORDER — ONDANSETRON HCL 4 MG/2ML IJ SOLN
INTRAMUSCULAR | Status: DC | PRN
  Administered 2022-02-09: 4 mg via INTRAVENOUS

## 2022-02-09 MED ORDER — PROPOFOL 10 MG/ML IV BOLUS
INTRAVENOUS | Status: DC | PRN
  Administered 2022-02-09: 150 mg via INTRAVENOUS

## 2022-02-09 MED ORDER — SUGAMMADEX SODIUM 500 MG/5ML IV SOLN
INTRAVENOUS | Status: AC
  Filled 2022-02-09: qty 5

## 2022-02-09 MED ORDER — BUPIVACAINE LIPOSOME 1.3 % IJ SUSP
INTRAMUSCULAR | Status: AC
  Filled 2022-02-09: qty 20

## 2022-02-09 MED ORDER — KETOROLAC TROMETHAMINE 30 MG/ML IJ SOLN: 30.0000 mg | Freq: Once | INTRAMUSCULAR | Status: DC

## 2022-02-09 MED ORDER — METHOCARBAMOL 750 MG PO TABS: 750.0000 mg | ORAL_TABLET | Freq: Three times a day (TID) | ORAL | 1 refills | Status: DC | PRN

## 2022-02-09 MED ORDER — DEXMEDETOMIDINE HCL IN NACL 80 MCG/20ML IV SOLN
INTRAVENOUS | Status: AC
  Filled 2022-02-09: qty 20

## 2022-02-09 MED ORDER — KETOROLAC TROMETHAMINE 30 MG/ML IJ SOLN
INTRAMUSCULAR | Status: AC
  Filled 2022-02-09: qty 1

## 2022-02-09 MED ORDER — LIDOCAINE HCL (PF) 2 % IJ SOLN
INTRAMUSCULAR | Status: AC
  Filled 2022-02-09: qty 5

## 2022-02-09 SURGICAL SUPPLY — 42 items
ADH SKN CLS APL DERMABOND .7 (GAUZE/BANDAGES/DRESSINGS) ×1
APL PRP STRL LF DISP 70% ISPRP (MISCELLANEOUS) ×1
CHLORAPREP W/TINT 26 (MISCELLANEOUS) ×1 IMPLANT
COVER LIGHT HANDLE STERIS (MISCELLANEOUS) IMPLANT
COVER MAYO STAND XLG (MISCELLANEOUS) ×1 IMPLANT
COVER TIP SHEARS 8 DVNC (MISCELLANEOUS) ×1 IMPLANT
COVER TIP SHEARS 8MM DA VINCI (MISCELLANEOUS) ×1
DEFOGGER SCOPE WARMER CLEARIFY (MISCELLANEOUS) IMPLANT
DERMABOND ADVANCED .7 DNX12 (GAUZE/BANDAGES/DRESSINGS) ×1 IMPLANT
DRAPE ARM DVNC X/XI (DISPOSABLE) ×3 IMPLANT
DRAPE COLUMN DVNC XI (DISPOSABLE) ×1 IMPLANT
DRAPE DA VINCI XI ARM (DISPOSABLE) ×3
DRAPE DA VINCI XI COLUMN (DISPOSABLE) ×1
DRSG TEGADERM 4X4.75 (GAUZE/BANDAGES/DRESSINGS) IMPLANT
GLOVE BIO SURGEON STRL SZ7 (GLOVE) IMPLANT
GLOVE BIOGEL PI IND STRL 7.0 (GLOVE) ×3 IMPLANT
GLOVE SURG SS PI 7.5 STRL IVOR (GLOVE) ×2 IMPLANT
GOWN STRL REUS W/TWL LRG LVL3 (GOWN DISPOSABLE) ×3 IMPLANT
MANIFOLD NEPTUNE II (INSTRUMENTS) ×1 IMPLANT
MESH VENTRALIGHT ST 4X6IN (Mesh General) IMPLANT
NDL HYPO 21X1.5 SAFETY (NEEDLE) ×1 IMPLANT
NDL INSUFFLATION 14GA 120MM (NEEDLE) ×1 IMPLANT
NEEDLE HYPO 21X1.5 SAFETY (NEEDLE) ×1 IMPLANT
NEEDLE INSUFFLATION 14GA 120MM (NEEDLE) ×1 IMPLANT
OBTURATOR OPTICAL STANDARD 8MM (TROCAR) ×1
OBTURATOR OPTICAL STND 8 DVNC (TROCAR) ×1
OBTURATOR OPTICALSTD 8 DVNC (TROCAR) ×1 IMPLANT
PACK LAP CHOLECYSTECTOMY (MISCELLANEOUS) ×1 IMPLANT
SEAL CANN UNIV 5-8 DVNC XI (MISCELLANEOUS) ×3 IMPLANT
SEAL XI 5MM-8MM UNIVERSAL (MISCELLANEOUS) ×3
SET TUBE SMOKE EVAC HIGH FLOW (TUBING) ×1 IMPLANT
SPONGE GAUZE 2X2 8PLY STRL LF (GAUZE/BANDAGES/DRESSINGS) IMPLANT
SUT MNCRL AB 4-0 PS2 18 (SUTURE) ×2 IMPLANT
SUT STRATAFIX 0 PDS+ CT-2 23 (SUTURE) ×1
SUT V-LOC 90 ABS 3-0 VLT  V-20 (SUTURE) ×3
SUT V-LOC 90 ABS 3-0 VLT V-20 (SUTURE) ×3 IMPLANT
SUTURE STRATFX 0 PDS+ CT-2 23 (SUTURE) ×1 IMPLANT
SYR 20ML LL LF (SYRINGE) ×1 IMPLANT
TAPE TRANSPORE STRL 2 31045 (GAUZE/BANDAGES/DRESSINGS) ×1 IMPLANT
TRAY FOLEY W/BAG SLVR 16FR (SET/KITS/TRAYS/PACK) ×1
TRAY FOLEY W/BAG SLVR 16FR ST (SET/KITS/TRAYS/PACK) ×1 IMPLANT
WATER STERILE IRR 500ML POUR (IV SOLUTION) ×1 IMPLANT

## 2022-02-09 NOTE — Anesthesia Postprocedure Evaluation (Signed)
Anesthesia Post Note  Patient: Christopher Rosales  Procedure(s) Performed: XI ROBOT ASSISTED UMBILICAL HERNIA REPAIR WITH MESH (Abdomen)  Patient location during evaluation: Phase II Anesthesia Type: General Level of consciousness: awake and alert and oriented Pain management: pain level controlled Vital Signs Assessment: post-procedure vital signs reviewed and stable Respiratory status: spontaneous breathing, nonlabored ventilation and respiratory function stable Cardiovascular status: blood pressure returned to baseline and stable Postop Assessment: no apparent nausea or vomiting Anesthetic complications: no  No notable events documented.   Last Vitals:  Vitals:   02/09/22 1215 02/09/22 1228  BP: 110/85 134/88  Pulse: 80 87  Resp: 19 16  Temp:  36.4 C  SpO2: 97% 99%    Last Pain:  Vitals:   02/09/22 1231  TempSrc:   PainSc: 3                  Jessaca Philippi C Nathan Stallworth

## 2022-02-09 NOTE — Anesthesia Preprocedure Evaluation (Signed)
Anesthesia Evaluation  Patient identified by MRN, date of birth, ID band Patient awake    Reviewed: Allergy & Precautions, H&P , NPO status , Patient's Chart, lab work & pertinent test results  Airway Mallampati: III  TM Distance: >3 FB Neck ROM: Full  Mouth opening: Limited Mouth Opening  Dental  (+) Dental Advisory Given, Caps,  Risk of damage/losing temporary crown was explained:   Pulmonary sleep apnea and Continuous Positive Airway Pressure Ventilation    Pulmonary exam normal breath sounds clear to auscultation       Cardiovascular negative cardio ROS Normal cardiovascular exam Rhythm:Regular Rate:Normal     Neuro/Psych  PSYCHIATRIC DISORDERS Anxiety      Neuromuscular disease    GI/Hepatic negative GI ROS,,,(+)     substance abuse  alcohol use  Endo/Other  negative endocrine ROS    Renal/GU negative Renal ROS  negative genitourinary   Musculoskeletal  (+) Arthritis , Osteoarthritis,    Abdominal   Peds negative pediatric ROS (+)  Hematology negative hematology ROS (+)   Anesthesia Other Findings   Reproductive/Obstetrics negative OB ROS                             Anesthesia Physical Anesthesia Plan  ASA: 2  Anesthesia Plan: General   Post-op Pain Management: Dilaudid IV   Induction: Intravenous  PONV Risk Score and Plan: 3 and Ondansetron, Dexamethasone and Midazolam  Airway Management Planned: Oral ETT and Video Laryngoscope Planned  Additional Equipment:   Intra-op Plan:   Post-operative Plan: Extubation in OR  Informed Consent: I have reviewed the patients History and Physical, chart, labs and discussed the procedure including the risks, benefits and alternatives for the proposed anesthesia with the patient or authorized representative who has indicated his/her understanding and acceptance.     Dental advisory given  Plan Discussed with: CRNA and  Surgeon  Anesthesia Plan Comments:         Anesthesia Quick Evaluation

## 2022-02-09 NOTE — Discharge Instructions (Addendum)
Remove umbilical packing in five days. Follow up for Feb.6 with Dr. Arnoldo Morale- please call for appointment

## 2022-02-09 NOTE — Transfer of Care (Signed)
Immediate Anesthesia Transfer of Care Note  Patient: Christopher Rosales  Procedure(s) Performed: XI ROBOT ASSISTED UMBILICAL HERNIA REPAIR WITH MESH (Abdomen)  Patient Location: PACU  Anesthesia Type:General  Level of Consciousness: drowsy, patient cooperative, and responds to stimulation  Airway & Oxygen Therapy: Patient Spontanous Breathing and Patient connected to nasal cannula oxygen  Post-op Assessment: Report given to RN, Post -op Vital signs reviewed and stable, Patient moving all extremities X 4, and Patient able to stick tongue midline  Post vital signs: Reviewed  Last Vitals:  Vitals Value Taken Time  BP 135/90 02/09/22 1127  Temp 98.2   Pulse 79 02/09/22 1129  Resp 18 02/09/22 1129  SpO2 100 % 02/09/22 1129  Vitals shown include unvalidated device data.  Last Pain:  Vitals:   02/09/22 0707  TempSrc: Oral  PainSc: 0-No pain      Patients Stated Pain Goal: 5 (63/01/60 1093)  Complications: No notable events documented.

## 2022-02-09 NOTE — Interval H&P Note (Signed)
History and Physical Interval Note:  02/09/2022 8:20 AM  Christopher Rosales  has presented today for surgery, with the diagnosis of UMBILICAL HERNIA, 3- 10 CM.  The various methods of treatment have been discussed with the patient and family. After consideration of risks, benefits and other options for treatment, the patient has consented to  Procedure(s): XI Ingram (N/A) as a surgical intervention.  The patient's history has been reviewed, patient examined, no change in status, stable for surgery.  I have reviewed the patient's chart and labs.  Questions were answered to the patient's satisfaction.     Aviva Signs

## 2022-02-09 NOTE — Anesthesia Procedure Notes (Signed)
Procedure Name: Intubation Date/Time: 02/09/2022 9:19 AM  Performed by: Maude Leriche, CRNAPre-anesthesia Checklist: Patient identified, Emergency Drugs available, Suction available and Patient being monitored Patient Re-evaluated:Patient Re-evaluated prior to induction Oxygen Delivery Method: Circle system utilized Preoxygenation: Pre-oxygenation with 100% oxygen Induction Type: IV induction Ventilation: Mask ventilation without difficulty Laryngoscope Size: Glidescope and 3 Grade View: Grade I Tube type: Oral Tube size: 7.5 mm Number of attempts: 2 Airway Equipment and Method: Stylet, Video-laryngoscopy and Bite block Placement Confirmation: ETT inserted through vocal cords under direct vision, positive ETCO2 and breath sounds checked- equal and bilateral Secured at: 23 cm Tube secured with: Tape Dental Injury: Teeth and Oropharynx as per pre-operative assessment  Comments: 1st attempt Miller 2 blade Grade III view

## 2022-02-09 NOTE — Op Note (Signed)
Patient:  Christopher Rosales  DOB:  September 24, 1965  MRN:  [id number]   Preop Diagnosis: Umbilical hernia  Postop Diagnosis: Same  Procedure: Robotic assisted laparoscopic umbilical herniorrhaphy with mesh  Surgeon: Aviva Signs, MD  Anes: General endotracheal  Indications: Patient is a 57 year old white male who presents with a symptomatic umbilical hernia.  The risks and benefits of the procedure including bleeding, infection, mesh use, recurrence of the hernia, and the possibility of an open procedure were fully explained to the patient, who gave informed consent.  Procedure note: The patient was placed in the supine position.  After induction of general endotracheal anesthesia, the abdomen was prepped and draped using usual sterile technique with ChloraPrep.  Surgical site confirmation was performed.  A Veress needle was introduced into the left upper quadrant without difficulty.  Confirmation of placement was done using the saline drop test.  The abdomen was then insufflated to 15 mmHg pressure.  An 8 mm trocar was introduced into the abdominal cavity under direct visualization without difficulty.  An additional 8 mm trocar was placed to the left flank region and another 1 placed in the left lower quadrant region.  The patient did have an umbilical hernia with some hernia sac and omentum present.  The robot was then targeted and docked.  I was able to excise the hernia sac and surrounding adipose tissue without difficulty.  Once this was done, I also released the falciform ligament from the abdominal wall.  An 0 stratafix was then used to close the defect longitudinally.  I did try to include the underside of the umbilicus.  The total defect size was 4 cm.  A 10 x 15 cm Ventralight DualMesh was then placed and secured to the abdominal wall circumferentially using a 3 OV lock 90 suture.  The resulting repair was tension-free.  All needles were removed from the abdominal cavity.  All air was then  evacuated from the abdominal cavity prior to removal of the trocars.  All wounds were irrigated with normal saline.  All wounds were injected with Exparel.  All incisions were closed using a 4-0 Monocryl subcuticular suture.  Dermabond was applied.  All tape and needle counts were correct at the end of the procedure.  The patient was extubated in the operating room and transferred to PACU in stable condition.  Complications: None  EBL: Minimal  Specimen: None

## 2022-02-09 NOTE — Progress Notes (Signed)
This RN attempted to call for follow up with no answer. Pt verbalizes understanding to call for follow up appt.

## 2022-02-19 HISTORY — PX: HERNIA REPAIR: SHX51

## 2022-02-24 ENCOUNTER — Ambulatory Visit (INDEPENDENT_AMBULATORY_CARE_PROVIDER_SITE_OTHER): Payer: BC Managed Care – PPO | Admitting: General Surgery

## 2022-02-24 ENCOUNTER — Encounter: Payer: Self-pay | Admitting: General Surgery

## 2022-02-24 ENCOUNTER — Encounter: Payer: Self-pay | Admitting: *Deleted

## 2022-02-24 VITALS — BP 123/78 | HR 78 | Temp 98.2°F | Resp 14 | Ht 68.0 in | Wt 198.0 lb

## 2022-02-24 DIAGNOSIS — Z09 Encounter for follow-up examination after completed treatment for conditions other than malignant neoplasm: Secondary | ICD-10-CM | POA: Diagnosis not present

## 2022-02-24 NOTE — Progress Notes (Unsigned)
Subjective:     Christopher Rosales  Patient here for postoperative visit, status post robotic assisted laparoscopic umbilical herniorrhaphy with mesh.  He is doing well.  He states he is about back to 90%.  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 Objective:    BP 123/78   Pulse 78   Temp 98.2 F (36.8 C) (Oral)   Resp 14   Ht 5\' 8"  (1.727 m)   Wt 198 lb (89.8 kg)   SpO2 98%   BMI 30.11 kg/m   General:  alert, cooperative, and no distress  Abdomen soft, incisions healing well.  Umbilical hernia resolved.     Assessment:    Doing well postoperatively.    Plan:   Continue gradually increasing activity.  May return to work without restrictions on 03/09/2022. Follow-up here as needed.

## 2022-04-02 ENCOUNTER — Encounter: Payer: Self-pay | Admitting: *Deleted

## 2022-05-19 ENCOUNTER — Ambulatory Visit: Payer: BC Managed Care – PPO | Admitting: Family Medicine

## 2022-05-21 ENCOUNTER — Ambulatory Visit: Payer: BC Managed Care – PPO | Admitting: Family Medicine

## 2022-06-08 DIAGNOSIS — R059 Cough, unspecified: Secondary | ICD-10-CM | POA: Diagnosis not present

## 2022-06-08 DIAGNOSIS — M25571 Pain in right ankle and joints of right foot: Secondary | ICD-10-CM | POA: Diagnosis not present

## 2022-06-16 ENCOUNTER — Ambulatory Visit: Payer: BC Managed Care – PPO | Admitting: Family Medicine

## 2022-07-15 ENCOUNTER — Other Ambulatory Visit: Payer: Self-pay | Admitting: Family Medicine

## 2022-08-13 ENCOUNTER — Ambulatory Visit: Payer: BC Managed Care – PPO | Admitting: Family Medicine

## 2022-09-03 ENCOUNTER — Encounter: Payer: Self-pay | Admitting: Family Medicine

## 2022-09-03 ENCOUNTER — Ambulatory Visit (INDEPENDENT_AMBULATORY_CARE_PROVIDER_SITE_OTHER): Payer: BC Managed Care – PPO

## 2022-09-03 ENCOUNTER — Ambulatory Visit: Payer: BC Managed Care – PPO | Admitting: Family Medicine

## 2022-09-03 VITALS — BP 117/72 | HR 87 | Temp 97.8°F | Ht 68.0 in | Wt 198.4 lb

## 2022-09-03 DIAGNOSIS — R202 Paresthesia of skin: Secondary | ICD-10-CM

## 2022-09-03 DIAGNOSIS — M25561 Pain in right knee: Secondary | ICD-10-CM | POA: Diagnosis not present

## 2022-09-03 MED ORDER — PREDNISONE 10 MG PO TABS: ORAL_TABLET | ORAL | 0 refills | Status: DC

## 2022-09-03 NOTE — Progress Notes (Signed)
Subjective:  Patient ID: CAYLEB TRESTER, male    DOB: 12-15-1965  Age: 57 y.o. MRN: [id number]  CC: No chief complaint on file.   HPI ALARICK GUFFIN presents for weird sensation in lower legs. Started lateral right knee. Stinging, burning. Intermittent. Brought on by squatting. Radiates to ankle, top of foot. Left is much less. A little burning in foot. Onset was two months. Spreading.  No alcohol for 35 days!     09/03/2022    9:37 AM 10/07/2021    1:19 PM 08/11/2021    8:12 AM  Depression screen PHQ 2/9  Decreased Interest 0 0 0  Down, Depressed, Hopeless 0 0 0  PHQ - 2 Score 0 0 0  Altered sleeping   3  Tired, decreased energy   0  Change in appetite   0  Feeling bad or failure about yourself    0  Trouble concentrating   0  Moving slowly or fidgety/restless   0  Suicidal thoughts   0  PHQ-9 Score   3  Difficult doing work/chores   Not difficult at all    History Donld has a past medical history of BPH (benign prostatic hyperplasia), DDD (degenerative disc disease), cervical, Hyperlipidemia, and Sleep apnea.   He has a past surgical history that includes Hernia repair (01/19/1993); Appendectomy (01/20/1979); LASIK; and Hernia repair (02/2022).   His family history is not on file. He was adopted.He reports that he has never smoked. He has never used smokeless tobacco. He reports current alcohol use of about 28.0 standard drinks of alcohol per week. He reports that he does not use drugs.    ROS Review of Systems  Constitutional:  Negative for activity change and fever.  Respiratory:  Negative for shortness of breath.   Cardiovascular:  Negative for chest pain.  Musculoskeletal:  Negative for arthralgias.  Skin:  Negative for rash.  Neurological:  Positive for numbness. Negative for dizziness.    Objective:  BP 117/72   Pulse 87   Temp 97.8 F (36.6 C)   Ht 5\' 8"  (1.727 m)   Wt 198 lb 6.4 oz (90 kg)   SpO2 99%   BMI 30.17 kg/m   BP Readings from Last 3  Encounters:  09/03/22 117/72  02/24/22 123/78  02/09/22 134/88    Wt Readings from Last 3 Encounters:  09/03/22 198 lb 6.4 oz (90 kg)  02/24/22 198 lb (89.8 kg)  02/09/22 190 lb 0.6 oz (86.2 kg)     Physical Exam Vitals reviewed.  Constitutional:      Appearance: He is well-developed.  HENT:     Head: Normocephalic and atraumatic.     Right Ear: External ear normal.     Left Ear: External ear normal.     Mouth/Throat:     Pharynx: No oropharyngeal exudate or posterior oropharyngeal erythema.  Eyes:     Pupils: Pupils are equal, round, and reactive to light.  Cardiovascular:     Rate and Rhythm: Normal rate and regular rhythm.     Heart sounds: No murmur heard. Pulmonary:     Effort: No respiratory distress.     Breath sounds: Normal breath sounds.  Musculoskeletal:     Cervical back: Normal range of motion and neck supple.  Neurological:     Mental Status: He is alert and oriented to person, place, and time.     Sensory: No sensory deficit.       Assessment & Plan:   Diagnoses  and all orders for this visit:  Paresthesias -     DG Knee 1-2 Views Right; Future  Other orders -     predniSONE (DELTASONE) 10 MG tablet; Take 5 daily for 3 days followed by 4,3,2 and 1 for 3 days each.       I have discontinued Onalee Hua B. Potier "[name]"'s methocarbamol. I am also having him start on predniSONE. Additionally, I am having him maintain his diclofenac, fenofibrate, clotrimazole-betamethasone, and Vardenafil HCl.  Allergies as of 09/03/2022       Reactions   Ivp Dye [iodinated Contrast Media] Hives   Other reaction(s): Unknown   Ciprofloxacin Other (See Comments)   Tingling in extremities, tinnitus, and mood disturbances   Statins Other (See Comments)   myalgias        Medication List        Accurate as of September 03, 2022 11:59 PM. If you have any questions, ask your nurse or doctor.          STOP taking these medications    methocarbamol 750 MG  tablet Commonly known as: Robaxin-750 Stopped by: Lekeith Wulf       TAKE these medications    clotrimazole-betamethasone cream Commonly known as: Lotrisone Apply 1 Application topically 2 (two) times daily. To affected areas until rash clears   diclofenac 75 MG EC tablet Commonly known as: VOLTAREN Take 1 tablet (75 mg total) by mouth 2 (two) times daily. For muscle and  Joint pain   fenofibrate 160 MG tablet Take 1 tablet (160 mg total) by mouth daily. For cholesterol and triglyceride   predniSONE 10 MG tablet Commonly known as: DELTASONE Take 5 daily for 3 days followed by 4,3,2 and 1 for 3 days each. Started by: Jayveon Convey   Vardenafil HCl 10 MG Tbdp Take 1 tablet by mouth daily as needed.         Follow-up: Return in about 1 month (around 10/04/2022).  Mechele Claude, M.D.

## 2022-09-04 ENCOUNTER — Encounter: Payer: Self-pay | Admitting: Family Medicine

## 2022-11-25 ENCOUNTER — Ambulatory Visit (INDEPENDENT_AMBULATORY_CARE_PROVIDER_SITE_OTHER): Payer: BC Managed Care – PPO | Admitting: Family Medicine

## 2022-11-25 ENCOUNTER — Encounter: Payer: Self-pay | Admitting: Family Medicine

## 2022-11-25 VITALS — BP 114/72 | HR 80 | Temp 97.5°F | Ht 68.0 in | Wt 198.0 lb

## 2022-11-25 DIAGNOSIS — R202 Paresthesia of skin: Secondary | ICD-10-CM

## 2022-11-25 DIAGNOSIS — M5030 Other cervical disc degeneration, unspecified cervical region: Secondary | ICD-10-CM

## 2022-11-25 DIAGNOSIS — E782 Mixed hyperlipidemia: Secondary | ICD-10-CM

## 2022-11-25 DIAGNOSIS — Z0000 Encounter for general adult medical examination without abnormal findings: Secondary | ICD-10-CM | POA: Diagnosis not present

## 2022-11-25 DIAGNOSIS — E559 Vitamin D deficiency, unspecified: Secondary | ICD-10-CM

## 2022-11-25 DIAGNOSIS — Z0001 Encounter for general adult medical examination with abnormal findings: Secondary | ICD-10-CM | POA: Diagnosis not present

## 2022-11-25 DIAGNOSIS — Z23 Encounter for immunization: Secondary | ICD-10-CM

## 2022-11-25 DIAGNOSIS — G609 Hereditary and idiopathic neuropathy, unspecified: Secondary | ICD-10-CM

## 2022-11-25 DIAGNOSIS — R52 Pain, unspecified: Secondary | ICD-10-CM

## 2022-11-25 LAB — URINALYSIS
Bilirubin, UA: NEGATIVE
Glucose, UA: NEGATIVE
Ketones, UA: NEGATIVE
Leukocytes,UA: NEGATIVE
Nitrite, UA: NEGATIVE
Protein,UA: NEGATIVE
RBC, UA: NEGATIVE
Specific Gravity, UA: 1.015 (ref 1.005–1.030)
Urobilinogen, Ur: 0.2 mg/dL (ref 0.2–1.0)
pH, UA: 7.5 (ref 5.0–7.5)

## 2022-11-25 MED ORDER — FENOFIBRATE 160 MG PO TABS: 160.0000 mg | ORAL_TABLET | Freq: Every day | ORAL | 3 refills | Status: DC

## 2022-11-25 MED ORDER — VARDENAFIL HCL 20 MG PO TABS: 20.0000 mg | ORAL_TABLET | Freq: Every day | ORAL | 5 refills | Status: DC | PRN

## 2022-11-25 NOTE — Progress Notes (Signed)
Subjective:  Patient ID: Christopher Rosales, male    DOB: 23-Jan-1965  Age: 57 y.o. MRN: [id number]  CC: Annual Exam   HPI Christopher Rosales presents for CPE  Last alcohol 4 mos ago.      11/25/2022   10:49 AM 09/03/2022    9:37 AM 10/07/2021    1:19 PM  Depression screen PHQ 2/9  Decreased Interest 0 0 0  Down, Depressed, Hopeless 0 0 0  PHQ - 2 Score 0 0 0    History Christopher Rosales has a past medical history of BPH (benign prostatic hyperplasia), DDD (degenerative disc disease), cervical, Hyperlipidemia, and Sleep apnea.   Christopher Rosales has a past surgical history that includes Hernia repair (01/19/1993); Appendectomy (01/20/1979); LASIK; and Hernia repair (02/2022).   His family history is not on file. Christopher Rosales was adopted.Christopher Rosales reports that Christopher Rosales has never smoked. Christopher Rosales has never used smokeless tobacco. Christopher Rosales reports current alcohol use of about 28.0 standard drinks of alcohol per week. Christopher Rosales reports that Christopher Rosales does not use drugs.    ROS Review of Systems  Constitutional:  Negative for activity change, fatigue and unexpected weight change.  HENT:  Negative for congestion, ear pain, hearing loss, postnasal drip and trouble swallowing.   Eyes:  Negative for pain and visual disturbance.  Respiratory:  Negative for cough, chest tightness and shortness of breath.   Cardiovascular:  Negative for chest pain, palpitations and leg swelling.  Gastrointestinal:  Negative for abdominal distention, abdominal pain, blood in stool, constipation, diarrhea, nausea and vomiting.  Endocrine: Negative for cold intolerance, heat intolerance and polydipsia.  Genitourinary:  Negative for difficulty urinating, dysuria, flank pain, frequency and urgency.       Erectile dysfunction  Musculoskeletal:  Positive for myalgias (legs ache. Some in upper body as well. Occurs randomly with varying intensity. Back and shoulders feel tight and achy. Weird sensation). Negative for arthralgias and joint swelling.  Skin:  Negative for color change, rash and  wound.  Neurological:  Negative for dizziness, syncope, speech difficulty, weakness, light-headedness, numbness and headaches.  Hematological:  Does not bruise/bleed easily.  Psychiatric/Behavioral:  Negative for confusion, decreased concentration, dysphoric mood and sleep disturbance. The patient is not nervous/anxious.   Feels creepy crawly at times. Similar to last year. Worse for 2 weeks.   Objective:  BP 114/72   Pulse 80   Temp (!) 97.5 F (36.4 C)   Ht 5\' 8"  (1.727 m)   Wt 198 lb (89.8 kg)   SpO2 98%   BMI 30.11 kg/m   BP Readings from Last 3 Encounters:  11/25/22 114/72  09/03/22 117/72  02/24/22 123/78    Wt Readings from Last 3 Encounters:  11/25/22 198 lb (89.8 kg)  09/03/22 198 lb 6.4 oz (90 kg)  02/24/22 198 lb (89.8 kg)     Physical Exam Constitutional:      Appearance: Christopher Rosales is well-developed.  HENT:     Head: Normocephalic and atraumatic.  Eyes:     Pupils: Pupils are equal, round, and reactive to light.  Neck:     Thyroid: No thyromegaly.     Trachea: No tracheal deviation.  Cardiovascular:     Rate and Rhythm: Normal rate and regular rhythm.     Heart sounds: Normal heart sounds. No murmur heard.    No friction rub. No gallop.  Pulmonary:     Breath sounds: Normal breath sounds. No wheezing or rales.  Abdominal:     General: Bowel sounds are normal. There is no  distension.     Palpations: Abdomen is soft. There is no mass.     Tenderness: There is no abdominal tenderness.     Hernia: There is no hernia in the left inguinal area.  Genitourinary:    Penis: Normal.      Testes: Normal.     Comments: Libido decreased to about every 10 days. About 1 time in 4 has failure of med.  Musculoskeletal:        General: Normal range of motion.     Cervical back: Normal range of motion.  Lymphadenopathy:     Cervical: No cervical adenopathy.  Skin:    General: Skin is warm and dry.  Neurological:     Mental Status: Christopher Rosales is alert and oriented to person,  place, and time.       Assessment & Plan:   Christopher Rosales" was seen today for annual exam.  Diagnoses and all orders for this visit:  Well adult exam -     CBC with Differential/Platelet -     CMP14+EGFR -     Lipid panel -     PSA, total and free -     Urinalysis -     VITAMIN D 25 Hydroxy (Vit-D Deficiency, Fractures)  Mixed hyperlipidemia -     Lipid panel -     fenofibrate 160 MG tablet; Take 1 tablet (160 mg total) by mouth daily. For cholesterol and triglyceride  Vitamin D deficiency -     Urinalysis  DDD (degenerative disc disease), cervical  Idiopathic peripheral neuropathy -     Sedimentation rate -     C-reactive protein -     CK -     Ambulatory referral to Neurology  Paresthesias -     Sedimentation rate -     C-reactive protein -     CK -     Ambulatory referral to Neurology  Generalized body aches  Other orders -     vardenafil (LEVITRA) 20 MG tablet; Take 1 tablet (20 mg total) by mouth daily as needed for erectile dysfunction.       I have discontinued Christopher Hua B. Simenson "[name]"'s Vardenafil HCl and predniSONE. I am also having him start on vardenafil. Additionally, I am having him maintain his diclofenac, clotrimazole-betamethasone, and fenofibrate.  Allergies as of 11/25/2022       Reactions   Ivp Dye [iodinated Contrast Media] Hives   Other reaction(s): Unknown   Ciprofloxacin Other (See Comments)   Tingling in extremities, tinnitus, and mood disturbances   Statins Other (See Comments)   myalgias        Medication List        Accurate as of November 25, 2022 11:43 AM. If you have any questions, ask your nurse or doctor.          STOP taking these medications    predniSONE 10 MG tablet Commonly known as: DELTASONE Stopped by: Christopher Rosales   Vardenafil HCl 10 MG Tbdp Replaced by: vardenafil 20 MG tablet Stopped by: Christopher Rosales       TAKE these medications    clotrimazole-betamethasone cream Commonly known as:  Lotrisone Apply 1 Application topically 2 (two) times daily. To affected areas until rash clears   diclofenac 75 MG EC tablet Commonly known as: VOLTAREN Take 1 tablet (75 mg total) by mouth 2 (two) times daily. For muscle and  Joint pain   fenofibrate 160 MG tablet Take 1 tablet (160 mg total) by mouth daily. For  cholesterol and triglyceride   vardenafil 20 MG tablet Commonly known as: Levitra Take 1 tablet (20 mg total) by mouth daily as needed for erectile dysfunction. Replaces: Vardenafil HCl 10 MG Tbdp Started by: Christopher Rosales         Follow-up: Return in about 1 year (around 11/25/2023).  Christopher Rosales, M.D.

## 2022-11-25 NOTE — Addendum Note (Signed)
Addended by: Adella Hare B on: 11/25/2022 02:16 PM   Modules accepted: Orders

## 2022-11-26 LAB — CBC WITH DIFFERENTIAL/PLATELET
Basophils Absolute: 0.1 10*3/uL (ref 0.0–0.2)
Basos: 1 %
EOS (ABSOLUTE): 0.3 10*3/uL (ref 0.0–0.4)
Eos: 5 %
Hematocrit: 45.5 % (ref 37.5–51.0)
Hemoglobin: 14.3 g/dL (ref 13.0–17.7)
Immature Grans (Abs): 0 10*3/uL (ref 0.0–0.1)
Immature Granulocytes: 0 %
Lymphocytes Absolute: 1.6 10*3/uL (ref 0.7–3.1)
Lymphs: 25 %
MCH: 29.1 pg (ref 26.6–33.0)
MCHC: 31.4 g/dL — ABNORMAL LOW (ref 31.5–35.7)
MCV: 93 fL (ref 79–97)
Monocytes Absolute: 0.8 10*3/uL (ref 0.1–0.9)
Monocytes: 12 %
Neutrophils Absolute: 3.7 10*3/uL (ref 1.4–7.0)
Neutrophils: 57 %
Platelets: 299 10*3/uL (ref 150–450)
RBC: 4.91 x10E6/uL (ref 4.14–5.80)
RDW: 12.9 % (ref 11.6–15.4)
WBC: 6.4 10*3/uL (ref 3.4–10.8)

## 2022-11-26 LAB — CMP14+EGFR
ALT: 35 [IU]/L (ref 0–44)
AST: 25 [IU]/L (ref 0–40)
Albumin: 4.5 g/dL (ref 3.8–4.9)
Alkaline Phosphatase: 60 [IU]/L (ref 44–121)
BUN/Creatinine Ratio: 11 (ref 9–20)
BUN: 12 mg/dL (ref 6–24)
Bilirubin Total: 0.6 mg/dL (ref 0.0–1.2)
CO2: 23 mmol/L (ref 20–29)
Calcium: 9.9 mg/dL (ref 8.7–10.2)
Chloride: 101 mmol/L (ref 96–106)
Creatinine, Ser: 1.09 mg/dL (ref 0.76–1.27)
Globulin, Total: 2.6 g/dL (ref 1.5–4.5)
Glucose: 82 mg/dL (ref 70–99)
Potassium: 4.3 mmol/L (ref 3.5–5.2)
Sodium: 139 mmol/L (ref 134–144)
Total Protein: 7.1 g/dL (ref 6.0–8.5)
eGFR: 78 mL/min/{1.73_m2} (ref >59)

## 2022-11-26 LAB — LIPID PANEL
Chol/HDL Ratio: 5.6 ratio — ABNORMAL HIGH (ref 0.0–5.0)
Cholesterol, Total: 214 mg/dL — ABNORMAL HIGH (ref 100–199)
HDL: 38 mg/dL — ABNORMAL LOW (ref >39)
LDL Chol Calc (NIH): 134 mg/dL — ABNORMAL HIGH (ref 0–99)
Triglycerides: 235 mg/dL — ABNORMAL HIGH (ref 0–149)
VLDL Cholesterol Cal: 42 mg/dL — ABNORMAL HIGH (ref 5–40)

## 2022-11-26 LAB — C-REACTIVE PROTEIN: CRP: 3 mg/L (ref 0–10)

## 2022-11-26 LAB — VITAMIN D 25 HYDROXY (VIT D DEFICIENCY, FRACTURES): Vit D, 25-Hydroxy: 38.5 ng/mL (ref 30.0–100.0)

## 2022-11-26 LAB — PSA, TOTAL AND FREE
PSA, Free Pct: 36 %
PSA, Free: 0.18 ng/mL
Prostate Specific Ag, Serum: 0.5 ng/mL (ref 0.0–4.0)

## 2022-11-26 LAB — SEDIMENTATION RATE: Sed Rate: 9 mm/h (ref 0–30)

## 2022-11-26 LAB — CK: Total CK: 139 U/L (ref 41–331)

## 2022-11-28 ENCOUNTER — Encounter: Payer: Self-pay | Admitting: Family Medicine

## 2022-11-30 ENCOUNTER — Telehealth: Payer: Self-pay | Admitting: *Deleted

## 2022-11-30 NOTE — Telephone Encounter (Signed)
Patient aware of lab results and tx recommendations

## 2023-03-22 ENCOUNTER — Ambulatory Visit: Payer: BC Managed Care – PPO | Admitting: Diagnostic Neuroimaging

## 2023-11-29 ENCOUNTER — Encounter: Payer: Self-pay | Admitting: Family Medicine

## 2023-11-29 ENCOUNTER — Ambulatory Visit: Payer: BC Managed Care – PPO | Admitting: Family Medicine

## 2023-11-29 VITALS — BP 110/71 | HR 67 | Temp 97.9°F | Ht 68.0 in | Wt 198.0 lb

## 2023-11-29 DIAGNOSIS — M791 Myalgia, unspecified site: Secondary | ICD-10-CM | POA: Diagnosis not present

## 2023-11-29 DIAGNOSIS — R202 Paresthesia of skin: Secondary | ICD-10-CM

## 2023-11-29 DIAGNOSIS — F5101 Primary insomnia: Secondary | ICD-10-CM | POA: Diagnosis not present

## 2023-11-29 DIAGNOSIS — Z1159 Encounter for screening for other viral diseases: Secondary | ICD-10-CM

## 2023-11-29 DIAGNOSIS — Z23 Encounter for immunization: Secondary | ICD-10-CM | POA: Diagnosis not present

## 2023-11-29 DIAGNOSIS — Z0000 Encounter for general adult medical examination without abnormal findings: Secondary | ICD-10-CM

## 2023-11-29 DIAGNOSIS — E559 Vitamin D deficiency, unspecified: Secondary | ICD-10-CM

## 2023-11-29 DIAGNOSIS — Z0001 Encounter for general adult medical examination with abnormal findings: Secondary | ICD-10-CM

## 2023-11-29 DIAGNOSIS — Z114 Encounter for screening for human immunodeficiency virus [HIV]: Secondary | ICD-10-CM

## 2023-11-29 DIAGNOSIS — E782 Mixed hyperlipidemia: Secondary | ICD-10-CM

## 2023-11-29 DIAGNOSIS — E7800 Pure hypercholesterolemia, unspecified: Secondary | ICD-10-CM | POA: Diagnosis not present

## 2023-11-29 LAB — URINALYSIS, ROUTINE W REFLEX MICROSCOPIC
Bilirubin, UA: NEGATIVE
Glucose, UA: NEGATIVE
Ketones, UA: NEGATIVE
Leukocytes,UA: NEGATIVE
Nitrite, UA: NEGATIVE
Protein,UA: NEGATIVE
RBC, UA: NEGATIVE
Specific Gravity, UA: 1.02 (ref 1.005–1.030)
Urobilinogen, Ur: 0.2 mg/dL (ref 0.2–1.0)
pH, UA: 7.5 (ref 5.0–7.5)

## 2023-11-29 LAB — LIPID PANEL

## 2023-11-29 MED ORDER — VARDENAFIL HCL 20 MG PO TABS: 20.0000 mg | ORAL_TABLET | Freq: Every day | ORAL | 5 refills | Status: AC | PRN

## 2023-11-29 MED ORDER — TRAZODONE HCL 150 MG PO TABS: ORAL_TABLET | ORAL | 5 refills | Status: DC

## 2023-11-29 MED ORDER — DICLOFENAC SODIUM 75 MG PO TBEC: 75.0000 mg | DELAYED_RELEASE_TABLET | Freq: Two times a day (BID) | ORAL | 2 refills | Status: AC

## 2023-11-29 NOTE — Progress Notes (Signed)
 Subjective:  Patient ID: Christopher Rosales, male    DOB: [birth year]  Age: 58 y.o. MRN: [id number]  CC: Insomnia (Can't stay asleep all night. Sometimes can't go back to sleep. Already on CPAP.), Generalized Body Aches (General aches and pains. Increased in the last year. Worse in the mornings with stiffness. ), and Abrasion (Scratches, bruises, and bleeds easily on forearms. No other places. A little less than a year. No clotting problems when bleeding. )   HPI  CPE today. Above noted  Discussed the use of AI scribe software for clinical note transcription with the patient, who gave verbal consent to proceed.  History of Present Illness Christopher Rosales is a 58 year old male with sleep apnea who presents with insomnia and generalized body aches.  He has experienced difficulty staying asleep for the past six months, going to bed between 10 and 11 PM and waking up around 3 to 3:30 AM. Sometimes he returns to sleep quickly, but other times he lies awake for up to two hours. Despite using a CPAP machine for sleep apnea, he feels tired throughout the day. He has been trying to reduce caffeine intake, currently consuming about three sodas a day, and has started taking a 1 mg melatonin supplement last week.  He describes generalized body aches, particularly in the mornings and after sitting for a while, with pain most pronounced in his legs, shoulders, and upper back. He has tried various pillows, including a Coop pillow with memory foam, but continues to experience discomfort. He previously stopped taking fenofibrate  due to achiness but has not noticed improvement in symptoms since discontinuation. He occasionally takes diclofenac  for pain relief.  He experiences random pins and needles sensations in his hands and feet every couple of days. He recalls a previous episode of stinging sensations in his knee, which improved with a steroid treatment but has since recurred.  No chest pain, heart  palpitations, shortness of breath, or gastrointestinal issues. He notes a decrease in energy levels, which he attributes to his sleep disturbances, and has started taking red ginseng to help with this.  His skin on the forearms is easily abraded and bleeds with minor trauma.        11/25/2022   10:49 AM 09/03/2022    9:37 AM 10/07/2021    1:19 PM  Depression screen PHQ 2/9  Decreased Interest 0 0 0  Down, Depressed, Hopeless 0 0 0  PHQ - 2 Score 0 0 0    History Christopher Rosales has a past medical history of BPH (benign prostatic hyperplasia), DDD (degenerative disc disease), cervical, Hyperlipidemia, and Sleep apnea.   He has a past surgical history that includes Hernia repair (01/19/1993); Appendectomy (01/20/1979); LASIK; and Hernia repair (02/2022).   His family history is not on file. He was adopted.He reports that he has never smoked. He has never used smokeless tobacco. He reports current alcohol use of about 28.0 standard drinks of alcohol per week. He reports that he does not use drugs.    ROS Review of Systems  Constitutional:  Negative for activity change, fatigue and unexpected weight change.  HENT:  Negative for congestion, ear pain, hearing loss, postnasal drip and trouble swallowing.   Eyes:  Negative for pain, itching and visual disturbance.  Respiratory:  Negative for cough, chest tightness and shortness of breath.   Cardiovascular:  Negative for chest pain, palpitations and leg swelling.  Gastrointestinal:  Negative for abdominal distention, abdominal pain, blood in stool, constipation, diarrhea,  nausea and vomiting.  Endocrine: Negative for cold intolerance, heat intolerance and polydipsia.  Genitourinary:  Negative for difficulty urinating, dysuria, flank pain, frequency and urgency.  Musculoskeletal:  Negative for arthralgias and joint swelling.  Skin:  Negative for color change, rash and wound.  Neurological:  Negative for dizziness, syncope, speech difficulty,  weakness, light-headedness, numbness and headaches.  Hematological:  Does not bruise/bleed easily.  Psychiatric/Behavioral:  Negative for confusion, decreased concentration, dysphoric mood and sleep disturbance. The patient is not nervous/anxious.     Objective:  BP 110/71   Pulse 67   Temp 97.9 F (36.6 C)   Ht 5' 8 (1.727 m)   Wt 198 lb (89.8 kg)   SpO2 97%   BMI 30.11 kg/m   BP Readings from Last 3 Encounters:  11/29/23 110/71  11/25/22 114/72  09/03/22 117/72    Wt Readings from Last 3 Encounters:  11/29/23 198 lb (89.8 kg)  11/25/22 198 lb (89.8 kg)  09/03/22 198 lb 6.4 oz (90 kg)     Physical Exam Constitutional:      Appearance: He is well-developed.  HENT:     Head: Normocephalic and atraumatic.  Eyes:     Pupils: Pupils are equal, round, and reactive to light.  Neck:     Thyroid : No thyromegaly.     Trachea: No tracheal deviation.  Cardiovascular:     Rate and Rhythm: Normal rate and regular rhythm.     Heart sounds: Normal heart sounds. No murmur heard.    No friction rub. No gallop.  Pulmonary:     Breath sounds: Normal breath sounds. No wheezing or rales.  Abdominal:     General: Bowel sounds are normal. There is no distension.     Palpations: Abdomen is soft. There is no mass.     Tenderness: There is no abdominal tenderness.     Hernia: There is no hernia in the left inguinal area.  Genitourinary:    Penis: Normal.      Testes: Normal.  Musculoskeletal:        General: Normal range of motion.     Cervical back: Normal range of motion.  Lymphadenopathy:     Cervical: No cervical adenopathy.  Skin:    General: Skin is warm and dry.  Neurological:     Mental Status: He is alert and oriented to person, place, and time.    Physical Exam GENERAL: Alert, cooperative, well developed, no acute distress. HEENT: Normocephalic, normal oropharynx, moist mucous membranes. CHEST: Clear to auscultation bilaterally, no wheezes, rhonchi, or  crackles. CARDIOVASCULAR: Normal heart rate and rhythm, S1 and S2 normal without murmurs. ABDOMEN: Soft, non-tender, non-distended, without organomegaly, normal bowel sounds. GENITOURINARY: No inguinal hernia. EXTREMITIES: No cyanosis or edema. NEUROLOGICAL: Cranial nerves grossly intact, extraocular movements intact, moves all extremities without gross motor or sensory deficit. SKIN: Skin not dry.   Assessment & Plan:  Well adult exam -     CBC with Differential/Platelet -     Comprehensive metabolic panel with GFR -     Lipid panel -     PSA, total and free -     VITAMIN D  25 Hydroxy (Vit-D Deficiency, Fractures) -     Urinalysis, Routine w reflex microscopic  Mixed hyperlipidemia -     Comprehensive metabolic panel with GFR -     PSA, total and free  Vitamin D  deficiency -     VITAMIN D  25 Hydroxy (Vit-D Deficiency, Fractures)  Hypercholesterolemia -  Comprehensive metabolic panel with GFR -     Lipid panel  Encounter for immunization -     Flu vaccine trivalent PF, 6mos and older(Flulaval,Afluria,Fluarix,Fluzone)  Need for hepatitis C screening test -     Hepatitis C antibody  Encounter for screening for HIV -     HIV Antibody (routine testing w rflx)  Primary insomnia  Myalgia  Other orders -     Vardenafil  HCl; Take 1 tablet (20 mg total) by mouth daily as needed for erectile dysfunction.  Dispense: 14 tablet; Refill: 5 -     Diclofenac  Sodium; Take 1 tablet (75 mg total) by mouth 2 (two) times daily. For muscle and  Joint pain  Dispense: 60 tablet; Refill: 2 -     traZODone  HCl; Use from 1/3 to 1 tablet nightly as needed for sleep.  Dispense: 30 tablet; Refill: 5    Assessment and Plan Assessment & Plan Adult Wellness Visit   Adult wellness visit focused on ongoing health concerns including insomnia, myalgia, idiopathic peripheral neuropathy with paresthesias, and mixed hyperlipidemia. General health maintenance was discussed, focusing on weight  management and hydration. He is encouraged to reach an ideal weight of 165 pounds and advised to increase water  intake, especially in the evening.  Insomnia   He experiences chronic insomnia with difficulty maintaining sleep for the past six months, sleeping 4-5 hours initially and then waking up with trouble returning to sleep, leading to daytime fatigue. His current bedtime routine includes watching TV and using tablets until bedtime, with a caffeine intake of three sodas daily. Recently started on melatonin 1 mg, which is insufficient. He received a printout on insomnia with suggestions for improving sleep hygiene. He is advised to reduce caffeine intake, especially after 3 PM, avoid screens one hour before bedtime, take a hot shower an hour before bedtime, read with a dim light before sleep, and sip on a decaffeinated warm drink before bed. Increase melatonin to 3 mg and monitor for two weeks. Discussed potential use of trazodone  if non-pharmacological measures fail.  Myalgia and idiopathic peripheral neuropathy with paresthesias   Chronic myalgia primarily affects his shoulders, upper back, and legs, worse in the morning. He has idiopathic peripheral neuropathy with paresthesias in hands and feet occurring every couple of days. Previous statin use exacerbated leg achiness. Diclofenac  is used occasionally for pain relief. Diclofenac  is increased to 75 mg twice daily for better symptom control. A nerve conduction velocity test is ordered to investigate neuropathy. He is advised to take diclofenac  with food, preferably at supper time.  Mixed hyperlipidemia   Previously on fenofibrate , which was discontinued due to perceived myalgia. Statins are not tolerated due to leg achiness. Restart fenofibrate  160 mg daily to prevent cardiovascular events.  Skin fragility of forearms   He has skin fragility on forearms with easy abrasion and bleeding upon minor trauma, likely age-related and possibly exacerbated  by sun exposure. Recommended using Cetaphil moisturizer twice daily on affected areas.  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 Blood work for HIV and hepatitis C screening is ordered.       Follow-up: Return in about 1 year (around 11/28/2024).  Christopher Rosales, M.D.

## 2023-11-29 NOTE — Addendum Note (Signed)
 Addended by: ZOLLIE LOWERS on: 11/29/2023 05:31 PM   Modules accepted: Orders

## 2023-11-29 NOTE — Patient Instructions (Signed)
 Insomnia Insomnia is a sleep disorder that makes it difficult to fall asleep or stay asleep. Insomnia can cause fatigue, low energy, difficulty concentrating, mood swings, and poor performance at work or school. There are three different ways to classify insomnia: Difficulty falling asleep. Difficulty staying asleep. Waking up too early in the morning. Any type of insomnia can be long-term (chronic) or short-term (acute). Both are common. Short-term insomnia usually lasts for 3 months or less. Chronic insomnia occurs at least three times a week for longer than 3 months. What are the causes? Insomnia may be caused by another condition, situation, or substance, such as: Having certain mental health conditions, such as anxiety and depression. Using caffeine, alcohol , tobacco, or drugs. Having gastrointestinal conditions, such as gastroesophageal reflux disease (GERD). Having certain medical conditions. These include: Asthma. Alzheimer's disease. Stroke. Chronic pain. An overactive thyroid  gland (hyperthyroidism). Other sleep disorders, such as restless legs syndrome and sleep apnea. Menopause. Sometimes, the cause of insomnia may not be known. What increases the risk? Risk factors for insomnia include: Gender. Females are affected more often than males. Age. Insomnia is more common as people get older. Stress and certain medical and mental health conditions. Lack of exercise. Having an irregular work schedule. This may include working night shifts and traveling between different time zones. What are the signs or symptoms? If you have insomnia, the main symptom is having trouble falling asleep or having trouble staying asleep. This may lead to other symptoms, such as: Feeling tired or having low energy. Feeling nervous about going to sleep. Not feeling rested in the morning. Having trouble concentrating. Feeling irritable, anxious, or depressed. How is this diagnosed? This condition  may be diagnosed based on: Your symptoms and medical history. Your health care provider may ask about: Your sleep habits. Any medical conditions you have. Your mental health. A physical exam. How is this treated? Treatment for insomnia depends on the cause. Treatment may focus on treating an underlying condition that is causing the insomnia. Treatment may also include: Medicines to help you sleep. Counseling or therapy. Lifestyle adjustments to help you sleep better. Follow these instructions at home: Eating and drinking  Limit or avoid alcohol , caffeinated beverages, and products that contain nicotine and tobacco, especially close to bedtime. These can disrupt your sleep. Do not eat a large meal or eat spicy foods right before bedtime. This can lead to digestive discomfort that can make it hard for you to sleep. Sleep habits  Keep a sleep diary to help you and your health care provider figure out what could be causing your insomnia. Write down: When you sleep. When you wake up during the night. How well you sleep and how rested you feel the next day. Any side effects of medicines you are taking. What you eat and drink. Make your bedroom a dark, comfortable place where it is easy to fall asleep. Put up shades or blackout curtains to block light from outside. Use a white noise machine to block noise. Keep the temperature cool. Limit screen use before bedtime. This includes: Not watching TV. Not using your smartphone, tablet, or computer. Stick to a routine that includes going to bed and waking up at the same times every day and night. This can help you fall asleep faster. Consider making a quiet activity, such as reading, part of your nighttime routine. Try to avoid taking naps during the day so that you sleep better at night. Get out of bed if you are still awake after  15 minutes of trying to sleep. Keep the lights down, but try reading or doing a quiet activity. When you feel  sleepy, go back to bed. General instructions Take over-the-counter and prescription medicines only as told by your health care provider. Exercise regularly as told by your health care provider. However, avoid exercising in the hours right before bedtime. Use relaxation techniques to manage stress. Ask your health care provider to suggest some techniques that may work well for you. These may include: Breathing exercises. Routines to release muscle tension. Visualizing peaceful scenes. Make sure that you drive carefully. Do not drive if you feel very sleepy. Keep all follow-up visits. This is important. Contact a health care provider if: You are tired throughout the day. You have trouble in your daily routine due to sleepiness. You continue to have sleep problems, or your sleep problems get worse. Get help right away if: You have thoughts about hurting yourself or someone else. Get help right away if you feel like you may hurt yourself or others, or have thoughts about taking your own life. Go to your nearest emergency room or: Call 911. Call the National Suicide Prevention Lifeline at 2232757840 or 988. This is open 24 hours a day. Text the Crisis Text Line at 657-529-4371. Summary Insomnia is a sleep disorder that makes it difficult to fall asleep or stay asleep. Insomnia can be long-term (chronic) or short-term (acute). Treatment for insomnia depends on the cause. Treatment may focus on treating an underlying condition that is causing the insomnia. Keep a sleep diary to help you and your health care provider figure out what could be causing your insomnia. This information is not intended to replace advice given to you by your health care provider. Make sure you discuss any questions you have with your health care provider. Document Revised: 12/16/2020 Document Reviewed: 12/16/2020 Elsevier Patient Education  2024 [employer].

## 2023-11-30 ENCOUNTER — Other Ambulatory Visit: Payer: Self-pay

## 2023-11-30 ENCOUNTER — Other Ambulatory Visit: Payer: Self-pay | Admitting: Family Medicine

## 2023-11-30 ENCOUNTER — Ambulatory Visit: Payer: Self-pay | Admitting: Family Medicine

## 2023-11-30 ENCOUNTER — Encounter: Payer: Self-pay | Admitting: Family Medicine

## 2023-11-30 DIAGNOSIS — E782 Mixed hyperlipidemia: Secondary | ICD-10-CM

## 2023-11-30 LAB — COMPREHENSIVE METABOLIC PANEL WITH GFR
ALT: 23 IU/L (ref 0–44)
AST: 17 IU/L (ref 0–40)
Albumin: 4.6 g/dL (ref 3.8–4.9)
Alkaline Phosphatase: 70 IU/L (ref 47–123)
BUN/Creatinine Ratio: 16 (ref 10–24)
BUN: 18 mg/dL (ref 8–27)
Bilirubin Total: 0.5 mg/dL (ref 0.0–1.2)
CO2: 23 mmol/L (ref 20–29)
Calcium: 10.2 mg/dL (ref 8.6–10.2)
Chloride: 102 mmol/L (ref 96–106)
Creatinine, Ser: 1.15 mg/dL (ref 0.76–1.27)
Globulin, Total: 2.6 g/dL (ref 1.5–4.5)
Glucose: 94 mg/dL (ref 70–99)
Potassium: 4.3 mmol/L (ref 3.5–5.2)
Sodium: 140 mmol/L (ref 134–144)
Total Protein: 7.2 g/dL (ref 6.0–8.5)
eGFR: 73 mL/min/1.73 (ref >59)

## 2023-11-30 LAB — LIPID PANEL
Cholesterol, Total: 222 mg/dL — AB (ref 100–199)
HDL: 41 mg/dL (ref >39)
LDL CALC COMMENT:: 5.4 ratio — AB (ref 0.0–5.0)
LDL Chol Calc (NIH): 134 mg/dL — AB (ref 0–99)
Triglycerides: 262 mg/dL — ABNORMAL HIGH (ref 0–149)
VLDL Cholesterol Cal: 47 mg/dL — AB (ref 5–40)

## 2023-11-30 LAB — CBC WITH DIFFERENTIAL/PLATELET
Basophils Absolute: 0.1 x10E3/uL (ref 0.0–0.2)
Basos: 1 %
EOS (ABSOLUTE): 0.2 x10E3/uL (ref 0.0–0.4)
Eos: 3 %
Hematocrit: 46.5 % (ref 37.5–51.0)
Hemoglobin: 15.2 g/dL (ref 13.0–17.7)
Immature Grans (Abs): 0 x10E3/uL (ref 0.0–0.1)
Immature Granulocytes: 0 %
Lymphocytes Absolute: 1.9 x10E3/uL (ref 0.7–3.1)
Lymphs: 29 %
MCH: 30.8 pg (ref 26.6–33.0)
MCHC: 32.7 g/dL (ref 31.5–35.7)
MCV: 94 fL (ref 79–97)
Monocytes Absolute: 0.9 x10E3/uL (ref 0.1–0.9)
Monocytes: 14 %
Neutrophils Absolute: 3.4 x10E3/uL (ref 1.4–7.0)
Neutrophils: 53 %
Platelets: 254 x10E3/uL (ref 150–450)
RBC: 4.94 x10E6/uL (ref 4.14–5.80)
RDW: 13 % (ref 11.6–15.4)
WBC: 6.4 x10E3/uL (ref 3.4–10.8)

## 2023-11-30 LAB — HEPATITIS C ANTIBODY: Hep C Virus Ab: NONREACTIVE

## 2023-11-30 LAB — PSA, TOTAL AND FREE
PSA, Free Pct: 28.3 %
PSA, Free: 0.17 ng/mL
Prostate Specific Ag, Serum: 0.6 ng/mL (ref 0.0–4.0)

## 2023-11-30 LAB — HIV ANTIBODY (ROUTINE TESTING W REFLEX): HIV Screen 4th Generation wRfx: NONREACTIVE

## 2023-11-30 LAB — VITAMIN D 25 HYDROXY (VIT D DEFICIENCY, FRACTURES): Vit D, 25-Hydroxy: 48.8 ng/mL (ref 30.0–100.0)

## 2023-11-30 MED ORDER — FENOFIBRATE 160 MG PO TABS: 160.0000 mg | ORAL_TABLET | Freq: Every day | ORAL | 3 refills | Status: DC

## 2023-12-15 ENCOUNTER — Encounter: Payer: Self-pay | Admitting: Family Medicine

## 2023-12-26 ENCOUNTER — Other Ambulatory Visit: Payer: Self-pay | Admitting: Family Medicine

## 2024-11-29 ENCOUNTER — Encounter: Admitting: Family Medicine
